# Patient Record
Sex: Female | Born: 1970 | Race: White | Hispanic: Yes | Marital: Single | Smoking: Never smoker
Health system: Southern US, Community
[De-identification: ages and names within clinical notes are randomized; demographics above are authoritative.]

## PROBLEM LIST (undated history)

## (undated) DIAGNOSIS — D500 Iron deficiency anemia secondary to blood loss (chronic): Secondary | ICD-10-CM

## (undated) DIAGNOSIS — D649 Anemia, unspecified: Secondary | ICD-10-CM

## (undated) DIAGNOSIS — N939 Abnormal uterine and vaginal bleeding, unspecified: Secondary | ICD-10-CM

## (undated) DIAGNOSIS — D259 Leiomyoma of uterus, unspecified: Secondary | ICD-10-CM

## (undated) HISTORY — PX: TUBAL LIGATION: SHX77

## (undated) HISTORY — PX: CHOLECYSTECTOMY: SHX55

---

## 2013-10-21 HISTORY — PX: CHOLECYSTECTOMY, LAPAROSCOPIC: SHX56

## 2021-02-08 ENCOUNTER — Other Ambulatory Visit: Payer: Self-pay

## 2021-02-08 ENCOUNTER — Emergency Department (HOSPITAL_COMMUNITY): Payer: Self-pay

## 2021-02-08 ENCOUNTER — Encounter (HOSPITAL_COMMUNITY): Payer: Self-pay

## 2021-02-08 ENCOUNTER — Emergency Department (HOSPITAL_COMMUNITY)
Admission: EM | Admit: 2021-02-08 | Discharge: 2021-02-08 | Disposition: A | Discharge status: Home / Self Care | Payer: Self-pay | Attending: Emergency Medicine | Admitting: Emergency Medicine

## 2021-02-08 DIAGNOSIS — R202 Paresthesia of skin: Secondary | ICD-10-CM | POA: Insufficient documentation

## 2021-02-08 DIAGNOSIS — M542 Cervicalgia: Secondary | ICD-10-CM | POA: Insufficient documentation

## 2021-02-08 DIAGNOSIS — R42 Dizziness and giddiness: Secondary | ICD-10-CM

## 2021-02-08 DIAGNOSIS — D649 Anemia, unspecified: Secondary | ICD-10-CM | POA: Insufficient documentation

## 2021-02-08 HISTORY — DX: Anemia, unspecified: D64.9

## 2021-02-08 LAB — COMPREHENSIVE METABOLIC PANEL
ALT: 16 U/L (ref 0–44)
AST: 23 U/L (ref 15–41)
Albumin: 3.4 g/dL — ABNORMAL LOW (ref 3.5–5.0)
Alkaline Phosphatase: 70 U/L (ref 38–126)
Anion gap: 8 (ref 5–15)
BUN: 8 mg/dL (ref 6–20)
CO2: 22 mmol/L (ref 22–32)
Calcium: 9 mg/dL (ref 8.9–10.3)
Chloride: 105 mmol/L (ref 98–111)
Creatinine, Ser: 0.61 mg/dL (ref 0.44–1.00)
GFR, Estimated: >60 mL/min (ref >60)
Glucose, Bld: 111 mg/dL — ABNORMAL HIGH (ref 70–99)
Potassium: 3.8 mmol/L (ref 3.5–5.1)
Sodium: 135 mmol/L (ref 135–145)
Total Bilirubin: 0.2 mg/dL — ABNORMAL LOW (ref 0.3–1.2)
Total Protein: 7.1 g/dL (ref 6.5–8.1)

## 2021-02-08 LAB — I-STAT BETA HCG BLOOD, ED (MC, WL, AP ONLY): I-stat hCG, quantitative: <5 m[IU]/mL (ref <5)

## 2021-02-08 LAB — CBC
HCT: 29.8 % — ABNORMAL LOW (ref 36.0–46.0)
Hemoglobin: 8.2 g/dL — ABNORMAL LOW (ref 12.0–15.0)
MCH: 20.6 pg — ABNORMAL LOW (ref 26.0–34.0)
MCHC: 27.5 g/dL — ABNORMAL LOW (ref 30.0–36.0)
MCV: 74.9 fL — ABNORMAL LOW (ref 80.0–100.0)
Platelets: 382 10*3/uL (ref 150–400)
RBC: 3.98 MIL/uL (ref 3.87–5.11)
RDW: 17.3 % — ABNORMAL HIGH (ref 11.5–15.5)
WBC: 8.2 10*3/uL (ref 4.0–10.5)
nRBC: 0 % (ref 0.0–0.2)

## 2021-02-08 LAB — TROPONIN I (HIGH SENSITIVITY)
Troponin I (High Sensitivity): 4 ng/L (ref <18)
Troponin I (High Sensitivity): 3 ng/L (ref <18)

## 2021-02-08 MED ORDER — SODIUM CHLORIDE 0.9 % IV BOLUS
1000.0000 mL | Freq: Once | INTRAVENOUS | Status: AC
  Administered 2021-02-08: 1000 mL via INTRAVENOUS

## 2021-02-08 MED ORDER — PROCHLORPERAZINE EDISYLATE 10 MG/2ML IJ SOLN
10.0000 mg | Freq: Once | INTRAMUSCULAR | Status: AC
  Administered 2021-02-08: 10 mg via INTRAVENOUS
  Filled 2021-02-08: qty 2

## 2021-02-08 MED ORDER — LIDOCAINE 5 % EX PTCH: 1.0000 | MEDICATED_PATCH | Freq: Every day | CUTANEOUS | 0 refills | Status: DC | PRN

## 2021-02-08 MED ORDER — DIPHENHYDRAMINE HCL 50 MG/ML IJ SOLN
12.5000 mg | Freq: Once | INTRAMUSCULAR | Status: AC
  Administered 2021-02-08: 12.5 mg via INTRAVENOUS
  Filled 2021-02-08: qty 1

## 2021-02-08 MED ORDER — NAPROXEN 500 MG PO TABS: 500.0000 mg | ORAL_TABLET | Freq: Two times a day (BID) | ORAL | 0 refills | Status: DC | PRN

## 2021-02-08 MED ORDER — METHOCARBAMOL 500 MG PO TABS: 500.0000 mg | ORAL_TABLET | Freq: Three times a day (TID) | ORAL | 0 refills | Status: DC | PRN

## 2021-02-08 MED ORDER — IOHEXOL 350 MG/ML SOLN
75.0000 mL | Freq: Once | INTRAVENOUS | Status: AC | PRN
  Administered 2021-02-08: 75 mL via INTRAVENOUS

## 2021-02-08 MED ORDER — LIDOCAINE 5 % EX PTCH
1.0000 | MEDICATED_PATCH | CUTANEOUS | Status: DC
  Administered 2021-02-08: 1 via TRANSDERMAL
  Filled 2021-02-08: qty 1

## 2021-02-08 NOTE — ED Triage Notes (Addendum)
Pt was at work - pt suddenly felt cold suddenly had intense pain to whole neck. Pt has been having neck pain x 1 week but got worse today.

## 2021-02-08 NOTE — ED Provider Notes (Signed)
[facility] EMERGENCY DEPARTMENT Provider Note   CSN: [id number] Arrival date & time: 02/08/21  0008     History Chief Complaint  Patient presents with  . Neck Pain  . Near Syncope    Autumn Duncan is a 50 y.o. female with a hx of anemia who presents to the ED with multiple complaints/concerns tonight. Patient states her primary concern is her neck pain, reports pain to the neck, primarily on the left side which has been fairly constant for the past 1-2 weeks, worse with movement, no alleviating factors. Had some brief LUE paresthesias over 1 week ago that have not reoccurred. No other numbness or weakness. She states that sometimes the pain in her neck radiates into her head. 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 Her co-worker informed her she was crying during this episode but she does not recall. She had a similar episode last week and has had them in the past when living in [place]. She has had some increased stress.She also mentions some intermittent left jaw pain- states sometimes she thinks it looks swollen there  She denies complete syncope, dyspnea, hemoptysis, leg pain/swelling, fever, IVDU, history of cancer, history of VTE, incontinence, retention, or saddle anesthesia.  No recent injuries. Currently only complaint is neck/head/jaw pain.   Interpreter utilized throughout Audiological scientist. HPI     Past Medical History:  Diagnosis Date  . Anemia     There are no problems to display for this patient.   Past Surgical History:  Procedure Laterality Date  . CHOLECYSTECTOMY       OB History   No obstetric history on file.     No family history on file.  Social History   Tobacco Use  . Smoking status: Never Smoker  . Smokeless tobacco: Never Used  Substance Use Topics  . Alcohol use: Not Currently    Alcohol/week: 1.0 - 2.0  standard drink    Types: 1 - 2 Cans of beer per week  . Drug use: Never    Home Medications Prior to Admission medications   Not on File    Allergies    Patient has no known allergies.  Review of Systems   Review of Systems  Constitutional: Negative for chills and fever.  HENT:       Positive for jaw pain.   Respiratory: Negative for shortness of breath.   Cardiovascular: Positive for chest pain. Negative for leg swelling.  Gastrointestinal: Negative for abdominal pain, blood in stool and vomiting.  Genitourinary: Negative for dysuria.  Musculoskeletal: Positive for neck pain.  Neurological: Positive for dizziness, light-headedness and headaches. Negative for syncope, weakness and numbness.  All other systems reviewed and are negative.   Physical Exam Updated Vital Signs BP (!) 151/81 (BP Location: Left Arm)   Pulse 73   Temp 98.4 F (36.9 C)   Resp 17   Ht 5\' 6"  (1.676 m)   Wt 81.6 kg   SpO2 100%   BMI 29.05 kg/m   Physical Exam Vitals and nursing note reviewed.  Constitutional:      General: She is not in acute distress.    Appearance: She is well-developed. She is not toxic-appearing.  HENT:     Head: Normocephalic and atraumatic.     Right Ear: Tympanic membrane is not perforated, erythematous, retracted or bulging.     Left Ear: Tympanic membrane is not  perforated, erythematous, retracted or bulging.     Nose: Nose normal.     Mouth/Throat:     Pharynx: Oropharynx is clear. Uvula midline.     Comments: Upper dentures present & removed. No gingival erythema, swelling, or fluctuance noted.  No gross abscess. Posterior oropharynx is symmetric appearing. Patient tolerating own secretions without difficulty. No trismus. No drooling. No hot potato voice. No swelling beneath the tongue, submandibular compartment is soft.   Patient has tenderness palpation over the left TMJ.  She is able to fully open and close her mouth.  Eyes:     General: Vision grossly  intact. Gaze aligned appropriately.        Right eye: No discharge.        Left eye: No discharge.     Extraocular Movements: Extraocular movements intact.     Conjunctiva/sclera: Conjunctivae normal.     Comments: PERRL. No proptosis.   Cardiovascular:     Rate and Rhythm: Normal rate and regular rhythm.     Comments: 2+ symmetric radial pulses. Pulmonary:     Effort: Pulmonary effort is normal. No respiratory distress.     Breath sounds: Normal breath sounds. No wheezing, rhonchi or rales.  Abdominal:     General: There is no distension.     Palpations: Abdomen is soft.     Tenderness: There is no abdominal tenderness. There is no guarding or rebound.  Musculoskeletal:     Cervical back: Normal range of motion and neck supple. No rigidity. Muscular tenderness (left sided) present. No spinous process tenderness.  Skin:    General: Skin is warm and dry.     Findings: No rash.  Neurological:     Comments: Alert. Clear speech. No facial droop. CNIII-XII grossly intact. Bilateral upper and lower extremities' sensation grossly intact. 5/5 symmetric strength with grip strength and with plantar and dorsi flexion bilaterally . Normal finger to nose bilaterally. Negative pronator drift. Negative Romberg sign. Gait is steady and intact.   Psychiatric:        Behavior: Behavior normal.     ED Results / Procedures / Treatments   Labs (all labs ordered are listed, but only abnormal results are displayed) Labs Reviewed  COMPREHENSIVE METABOLIC PANEL - Abnormal; Notable for the following components:      Result Value   Glucose, Bld 111 (*)    Albumin 3.4 (*)    Total Bilirubin 0.2 (*)    All other components within normal limits  CBC - Abnormal; Notable for the following components:   Hemoglobin 8.2 (*)    HCT 29.8 (*)    MCV 74.9 (*)    MCH 20.6 (*)    MCHC 27.5 (*)    RDW 17.3 (*)    All other components within normal limits  I-STAT BETA HCG BLOOD, ED ([facility], [facility], [facility] ONLY)  TROPONIN I  (HIGH SENSITIVITY)  TROPONIN I (HIGH SENSITIVITY)    EKG None  Radiology CT Angio Head W or Wo Contrast  Result Date: 02/08/2021 CLINICAL DATA:  Sudden onset intense pain in the neck. EXAM: CT ANGIOGRAPHY HEAD AND NECK TECHNIQUE: Multidetector CT imaging of the head and neck was performed using the standard protocol during bolus administration of intravenous contrast. Multiplanar CT image reconstructions and MIPs were obtained to evaluate the vascular anatomy. Carotid stenosis measurements (when applicable) are obtained utilizing NASCET criteria, using the distal internal carotid diameter as the denominator. CONTRAST:  75mL OMNIPAQUE IOHEXOL 350 MG/ML SOLN COMPARISON:  None. FINDINGS: CT  HEAD FINDINGS Brain: No evidence of acute infarction, hemorrhage, hydrocephalus, extra-axial collection or mass lesion/mass effect. Mineralization at the bilateral globus pallidus, likely incidental for age. Vascular: See below Skull: No fracture or destructive lesion. Mastoids and middle ears are clear. Sinuses: Imaged portions are clear. Orbits: Negative Review of the MIP images confirms the above findings CTA NECK FINDINGS Aortic arch: Normal with 3 vessel branching. Right carotid system: Vessels are smooth and widely patent with no atheromatous changes. Left carotid system: Vessels are smooth and widely patent with no atheromatous changes. Vertebral arteries: No proximal subclavian or vertebral stenosis. The vertebral arteries are smoothly contoured. Skeleton: Negative Other neck: No visible inflammation or hemorrhage. Bilateral thyroid nodules measuring up to 9 mm on the right. No followup recommended (ref: J Am Coll Radiol. 2015 Feb;12(2): 143-50). Upper chest: Negative Review of the MIP images confirms the above findings CTA HEAD FINDINGS Anterior circulation: No significant stenosis, proximal occlusion, aneurysm, or vascular malformation. Posterior circulation: No significant stenosis, proximal occlusion, aneurysm,  or vascular malformation. Venous sinuses: As permitted by contrast timing, patent. Anatomic variants: None Review of the MIP images confirms the above findings IMPRESSION: Negative CTA of the head and neck. Electronically Signed   By: Marnee Spring M.D.   On: 02/08/2021 06:29   DG Chest 2 View  Result Date: 02/08/2021 CLINICAL DATA:  Chest pain EXAM: CHEST - 2 VIEW COMPARISON:  None. FINDINGS: Normal heart size and mediastinal contours. No acute infiltrate or edema. No effusion or pneumothorax. No acute osseous findings. IMPRESSION: Negative chest. Electronically Signed   By: Marnee Spring M.D.   On: 02/08/2021 05:02   CT Angio Neck W and/or Wo Contrast  Result Date: 02/08/2021 CLINICAL DATA:  Sudden onset intense pain in the neck. EXAM: CT ANGIOGRAPHY HEAD AND NECK TECHNIQUE: Multidetector CT imaging of the head and neck was performed using the standard protocol during bolus administration of intravenous contrast. Multiplanar CT image reconstructions and MIPs were obtained to evaluate the vascular anatomy. Carotid stenosis measurements (when applicable) are obtained utilizing NASCET criteria, using the distal internal carotid diameter as the denominator. CONTRAST:  66mL OMNIPAQUE IOHEXOL 350 MG/ML SOLN COMPARISON:  None. FINDINGS: CT HEAD FINDINGS Brain: No evidence of acute infarction, hemorrhage, hydrocephalus, extra-axial collection or mass lesion/mass effect. Mineralization at the bilateral globus pallidus, likely incidental for age. Vascular: See below Skull: No fracture or destructive lesion. Mastoids and middle ears are clear. Sinuses: Imaged portions are clear. Orbits: Negative Review of the MIP images confirms the above findings CTA NECK FINDINGS Aortic arch: Normal with 3 vessel branching. Right carotid system: Vessels are smooth and widely patent with no atheromatous changes. Left carotid system: Vessels are smooth and widely patent with no atheromatous changes. Vertebral arteries: No  proximal subclavian or vertebral stenosis. The vertebral arteries are smoothly contoured. Skeleton: Negative Other neck: No visible inflammation or hemorrhage. Bilateral thyroid nodules measuring up to 9 mm on the right. No followup recommended (ref: J Am Coll Radiol. 2015 Feb;12(2): 143-50). Upper chest: Negative Review of the MIP images confirms the above findings CTA HEAD FINDINGS Anterior circulation: No significant stenosis, proximal occlusion, aneurysm, or vascular malformation. Posterior circulation: No significant stenosis, proximal occlusion, aneurysm, or vascular malformation. Venous sinuses: As permitted by contrast timing, patent. Anatomic variants: None Review of the MIP images confirms the above findings IMPRESSION: Negative CTA of the head and neck. Electronically Signed   By: Marnee Spring M.D.   On: 02/08/2021 06:29    Procedures Procedures   Medications Ordered  in ED Medications - No data to display  ED Course  I have reviewed the triage vital signs and the nursing notes.  Pertinent labs & imaging results that were available during my care of the patient were reviewed by me and considered in my medical decision making (see chart for details).    MDM Rules/Calculators/A&P                         Patient presents to the ED with complaints of head/neck pain for the past 1 to 2 weeks, near syncope this evening, as well as some intermittent jaw pain.  Patient is nontoxic, resting comfortably, her vitals are notable for mildly elevated blood pressure and tachycardia which has normalized.  I have a low suspicion for hypertensive emergency.  On exam patient does have left cervical paraspinal muscle tenderness to palpation and left TMJ tenderness. She has no focal neurologic deficits.  Exam is otherwise benign.   Additional history obtained:  Additional history obtained from chart review & nursing note review.   EKG: No significant arrhythmias or ischemic changes noted. Lab Tests:   I Ordered, reviewed, and interpreted labs, which included:  CBC: Anemia with hemoglobin of 8.2 and hematocrit of 29.8,known hx of anemia- no prior labs on record.  No leukocytosis. CMP: Mild hypoalbuminemia.  No other significant derangements. Pregnancy test: Negative Troponin: Within normal limit  Imaging Studies ordered:  I ordered imaging studies which included CXR & CTA head/neck, I independently reviewed, formal radiology impression shows:  CXR: Negative chest CTA head/neck: Negative CTA of the head and neck.   ED Course:  Labs with anemia, known history of this, no prior on record for comparison, PCP follow-up.  Work- up overall reassuring thus far. EKG without acute ischemia, initial troponin within normal limits, delta pending.  Patient is not hypoxic, no chest pain/dyspnea at this time, low suspicion for PE.  Her chest x-ray does not show infiltrate, pneumothorax, or pulmonary edema.  EKG without arrhythmia.  In regards to her head/neck pain with a near syncopal event CT angio does not show acute process.  She has no focal neurologic deficits.  Her neck pain is reproducible with left cervical paraspinal muscle palpation which I think is likely contributing to her symptoms potentially the underlying cause.  She also has some tenderness to the left TMJ which I suspect is the underlying cause of her jaw pain.  She has no intraoral infection noted, no abscess, exam not consistent with Ludwig's angina.   I have ordered fluids, a migraine cocktail, and a Lidoderm patch for symptomatic care.  06:30: Patient care signed out to [facility], PA-C at change of shift pending delta troponin & re-evaluation.  If no significant abnormality anticipate discharge home with supportive care and primary care follow-up  Findings and plan of care discussed with supervising physician Dr. Pilar Plate who is in agreement.   Portions of this note were generated with Scientist, clinical (histocompatibility and immunogenetics). Dictation errors may  occur despite best attempts at proofreading.  Final Clinical Impression(s) / ED Diagnoses Final diagnoses:  Neck pain  Lightheadedness  Anemia, unspecified type    Rx / DC Orders ED Discharge Orders    None       Cherly Anderson, PA-C 02/08/21 7510    Sabas Sous, MD 02/08/21 [phone]

## 2021-02-08 NOTE — ED Triage Notes (Signed)
Emergency Medicine Provider Triage Evaluation Note  Autumn Duncan , a 50 y.o. female  was evaluated in triage.  Pt complains of neck pain x 1 week. More so to the left side. Also reports left upper dental pain. Denies injury. Had some LUE paresthesias a few days ago, none since.   Review of Systems  Positive: Dental pain, neck pain, episode of paresthesias.  Negative: Fever, syncope, dysphagia, chest pain, dyspnea, complete numbness, weakness.   Physical Exam  BP (!) 145/89   Pulse (!) 106   Temp 98.4 F (36.9 C)   Resp 16   Ht 5\' 6"  (1.676 m)   Wt 81.6 kg   SpO2 100%   BMI 29.05 kg/m  Gen:   Awake, no distress   HEENT:  Atraumatic.  Uvula midline.  Airway patent.   Resp:  Normal effort Cardiac:  Mild tachycardia Abd:   Nondistended, nontender.  MSK:   Left cervical paraspinal muscle tenderness.  Neuro:  Speech clear. Sensation grossly intact to bilateral upper extremities.  5/5 Symmetric grip strength.  Medical Decision Making  Medically screening exam initiated at 12:31 AM.  Appropriate orders placed.  Niala Stcharles was informed that the remainder of the evaluation will be completed by another provider, this initial triage assessment does not replace that evaluation, and the importance of remaining in the ED until their evaluation is complete.  Clinical Impression  Neck pain  Interpretor utilized during encounter.    Gale Journey, Cherly Anderson 02/08/21 [phone]

## 2021-02-08 NOTE — Discharge Instructions (Addendum)
You were seen in the Er today for neck pain, headache, dizziness, and jaw pain.  Your work-up was overall reassuring.  Your labs did show that you are anemic with a hemoglobin of 8.3, please have this rechecked by your primary care provider.  Your heart enzyme and chest x-ray were normal.  The CT scans of your head and neck did not show any significant abnormalities.  We suspect that the pain in your neck and the pain in your jaw could be related to muscle pain/joint irritation.  We are sending you home with the following medicines:  - Naproxen is a nonsteroidal anti-inflammatory medication that will help with pain and swelling. Be sure to take this medication as prescribed with food, 1 pill every 12 hours,  It should be taken with food, as it can cause stomach upset, and more seriously, stomach bleeding. Do not take other nonsteroidal anti-inflammatory medications with this such as Advil, Motrin, Aleve, Mobic, Goodie Powder, or Motrin.    - Robaxin is the muscle relaxer I have prescribed, this is meant to help with muscle tightness. Be aware that this medication may make you drowsy therefore the first time you take this it should be at a time you are in an environment where you can rest. Do not drive or operate heavy machinery when taking this medication. Do not drink alcohol or take other sedating medications with this medicine such as narcotics or benzodiazepines.   - Lidoderm patch-apply 1 patch to area of most significant pain in your neck once per day.  Remove and discard patch within 12 hours of application. You make take Tylenol per over the counter dosing with these medications.   We have prescribed you new medication(s) today. Discuss the medications prescribed today with your pharmacist as they can have adverse effects and interactions with your other medicines including over the counter and prescribed medications. Seek medical evaluation if you start to experience new or abnormal symptoms  after taking one of these medicines, seek care immediately if you start to experience difficulty breathing, feeling of your throat closing, facial swelling, or rash as these could be indications of a more serious allergic reaction   Please be sure to stay well-hydrated.  Please follow-up primary care, call today to schedule follow-up appointment as soon as possible.  If you do not have a primary care provider please see attached circled phone number.  Return to the ER for any new or worsening symptoms or any other concerns.  Google Translate: Lo vieron en Urgencias hoy por dolor de cuello, dolor de [place], mareos y Engineer, mining de [place]. Su evaluacin fue en general tranquilizadora. Sus anlisis mostraron que usted est anmico con una hemoglobina de 8.3, haga que su proveedor de atencin primaria lo vuelva a Dentist. Su enzima cardaca y la radiografa de trax fueron normales. Las tomografas computarizadas de su cabeza y cuello no mostraron anomalas significativas.  Sospechamos que Engineer, site cuello y Chief Technology Officer en la mandbula podran estar relacionados con el dolor muscular o la irritacin de las articulaciones. Te estamos enviando a casa con los siguientes medicamentos:  - El naproxeno es un medicamento antiinflamatorio no esteroideo que ayudar con Chief Technology Officer y la hinchazn. Asegrese de tomar [employer] segn lo prescrito con alimentos, 1 pastilla cada 12 horas. Debe tomarse con alimentos, ya que puede causar [employer] y, lo que es ms grave, sangrado estomacal. No tome otros medicamentos antiinflamatorios no esteroideos con esto, como Advil, Motrin, Aleve, Mobic, Goodie Powder  o Motrin.  - Robaxin es el relajante muscular que le he recetado, est destinado a ayudar con la tensin muscular. Tenga en cuenta que este medicamento puede provocarle somnolencia, por lo que la primera vez que lo tome debe ser en un momento en el que se encuentre en un entorno en el que pueda descansar. No  conduzca ni maneje maquinaria pesada mientras toma este medicamento. No beba alcohol ni tome otros medicamentos sedantes con este medicamento, como narcticos o benzodiazepinas.  - Parche de Lidoderm: aplique 1 parche en el rea de mayor dolor en el cuello una vez al da. Retire y deseche el parche dentro de las 12 horas posteriores a la aplicacin. Debe tomar Tylenol por dosificacin de venta libre con estos medicamentos.  Le hemos recetado nuevos medicamentos hoy. Hable sobre los medicamentos recetados hoy con su farmacutico, ya que pueden tener efectos adversos e interacciones con sus otros medicamentos, incluidos los medicamentos recetados y de [place]. Busque una evaluacin mdica si comienza a experimentar sntomas nuevos o anormales despus de tomar uno de estos medicamentos, busque atencin mdica de inmediato si comienza a experimentar dificultad para respirar, sensacin de que se le cierra la garganta, hinchazn facial o sarpullido, ya que estos podran ser indicios de una enfermedad ms grave. reaccin alrgica   Por favor, asegrese de mantenerse bien hidratado. Haga un seguimiento de atencin primaria, llame hoy para programar una cita de seguimiento lo antes posible. Si no tiene un proveedor de atencin primaria, consulte el nmero de telfono adjunto con un crculo. Regrese a la sala de emergencias por cualquier sntoma nuevo o que empeore o cualquier otra inquietud.

## 2021-02-08 NOTE — ED Provider Notes (Signed)
Patient is a 50 year old female whose care was transferred to me at shift change from [facility].  Her HPI is below:  Autumn Duncan is a 50 y.o. female with a hx of anemia who presents to the ED with multiple complaints/concerns tonight. Patient states her primary concern is her neck pain, reports pain to the neck, primarily on the left side which has been fairly constant for the past 1-2 weeks, worse with movement, no alleviating factors. Had some brief LUE paresthesias over 1 week ago that have not reoccurred. No other numbness or weakness. She states that sometimes the pain in her neck radiates into her head. 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 Her co-worker informed her she was crying during this episode but she does not recall. She had a similar episode last week and has had them in the past when living in [place]. She has had some increased stress.She also mentions some intermittent left jaw pain- states sometimes she thinks it looks swollen there  She denies complete syncope, dyspnea, hemoptysis, leg pain/swelling, fever, IVDU, history of cancer, history of VTE, incontinence, retention, or saddle anesthesia.  No recent injuries. Currently only complaint is neck/head/jaw pain.   Interpreter utilized throughout Audiological scientist. Physical Exam  BP (!) 146/78 (BP Location: Right Arm)   Pulse 76   Temp 98.4 F (36.9 C)   Resp 18   Ht 5\' 6"  (1.676 m)   Wt 81.6 kg   SpO2 100%   BMI 29.05 kg/m   Physical Exam Vitals and nursing note reviewed.  Constitutional:      General: She is not in acute distress.    Appearance: She is well-developed. She is not toxic-appearing.  HENT:     Head: Normocephalic and atraumatic.     Right Ear: Tympanic membrane is not perforated, erythematous, retracted or bulging.     Left Ear: Tympanic membrane is not perforated, erythematous,  retracted or bulging.     Nose: Nose normal.     Mouth/Throat:     Pharynx: Oropharynx is clear. Uvula midline.     Comments: Upper dentures present & removed. No gingival erythema, swelling, or fluctuance noted.  No gross abscess. Posterior oropharynx is symmetric appearing. Patient tolerating own secretions without difficulty. No trismus. No drooling. No hot potato voice. No swelling beneath the tongue, submandibular compartment is soft.   Patient has tenderness palpation over the left TMJ.  She is able to fully open and close her mouth.  Eyes:     General: Vision grossly intact. Gaze aligned appropriately.        Right eye: No discharge.        Left eye: No discharge.     Extraocular Movements: Extraocular movements intact.     Conjunctiva/sclera: Conjunctivae normal.     Comments: PERRL. No proptosis.   Cardiovascular:     Rate and Rhythm: Normal rate and regular rhythm.     Comments: 2+ symmetric radial pulses. Pulmonary:     Effort: Pulmonary effort is normal. No respiratory distress.     Breath sounds: Normal breath sounds. No wheezing, rhonchi or rales.  Abdominal:     General: There is no distension.     Palpations: Abdomen is soft.     Tenderness: There is no abdominal tenderness. There is no guarding or rebound.  Musculoskeletal:     Cervical back: Normal range of motion and neck supple.  No rigidity. Muscular tenderness (left sided) present. No spinous process tenderness.  Skin:    General: Skin is warm and dry.     Findings: No rash.  Neurological:     Comments: Alert. Clear speech. No facial droop. CNIII-XII grossly intact. Bilateral upper and lower extremities' sensation grossly intact. 5/5 symmetric strength with grip strength and with plantar and dorsi flexion bilaterally . Normal finger to nose bilaterally. Negative pronator drift. Negative Romberg sign. Gait is steady and intact.   Psychiatric:        Behavior: Behavior normal.  ED Course/Procedures      Procedures  MDM  Patient is a 50 year old female who presents the emergency department due to neck pain and paresthesias.  Her care was transferred to me at shift change from [facility].  Please see her note below for additional information.  Patient given a migraine cocktail as well as a lidocaine patch with improvement in her symptoms.  She states she is feeling much better.  Initial troponin of 4 with a repeat of 3.  CTA was obtained of the head and neck which were negative.  Unsure the cause of her symptoms.  Likely musculoskeletal nature.  She was given prescriptions for Naprosyn as well as Robaxin.  Discussed return precautions at length.  Feel that she is stable for discharge at this time and she is agreeable.  Her questions were answered and she was amicable at the time of discharge.      Placido Sou, PA-C 02/08/21 [id number]    Eber Hong, MD 02/09/21 [phone]

## 2021-02-13 ENCOUNTER — Encounter (INDEPENDENT_AMBULATORY_CARE_PROVIDER_SITE_OTHER): Payer: Self-pay

## 2021-02-13 ENCOUNTER — Other Ambulatory Visit: Payer: Self-pay

## 2021-02-19 NOTE — Progress Notes (Signed)
[id number]  Subjective:    Autumn Duncan - 50 y.o. female MRN [id number]  Date of birth: 07-07-1971  HPI  Autumn Duncan is to establish care and hospital follow-up.   Current issues and/or concerns: Visit 02/08/2021 at [facility] Emergency Department per MD note: Patient is a 50 year old female who presents the emergency department due to neck pain and paresthesias.  Her care was transferred to me at shift change from [facility].  Please see her note below for additional information.  Patient given a migraine cocktail as well as a lidocaine patch with improvement in her symptoms.  She states she is feeling much better.  Initial troponin of 4 with a repeat of 3.  CTA was obtained of the head and neck which were negative.  Unsure the cause of her symptoms.  Likely musculoskeletal nature.  She was given prescriptions for Naprosyn as well as Robaxin.  Discussed return precautions at length.  Feel that she is stable for discharge at this time and she is agreeable.  Her questions were answered and she was amicable at the time of discharge.  02/20/2021: NECK PAIN FOLLOW UP: Reports since hospital discharge still having intermittent head and neck pain. Left side of neck worse than right side. Jaw pain still present, left greater than right. Difficult to chew because of jaw pain. Chest discomfort still comes and goes. Denies any recent near-syncopal/syncopal events.   ANEMIA FOLLOW-UP: Reports she moved from [place] to [place] in June 2021. About 1 year ago while still living in [place] she had two oral surgeries which resulted in post-op heavy bleeding and blood transfusions administered. Around the same time she developed irregular menses which would last up to 15 days. Reports using 2 sanitary napkins per hour, described as blood pouring out, and had to lay in bed for 5 days to help control bleeding. She was eventually started on iron pills. She is still having  heavy and irregular menses with heavy bleeding.   ROS per HPI    Health Maintenance:  Health Maintenance Due  Topic Date Due  . Hepatitis C Screening  Never done  . COVID-19 Vaccine (1) Never done  . HIV Screening  Never done  . TETANUS/TDAP  Never done  . PAP SMEAR-Modifier  Never done  . COLONOSCOPY (Pts 45-22yrs Insurance coverage will need to be confirmed)  Never done  . MAMMOGRAM  Never done    Past Medical History: There are no problems to display for this patient.  Social History   reports that she has never smoked. She has never used smokeless tobacco. She reports previous alcohol use of about 1.0 - 2.0 standard drink of alcohol per week. She reports that she does not use drugs.   Family History  family history is not on file.   Medications: reviewed and updated   Objective:   Physical Exam BP 124/84 (BP Location: Left Arm, Patient Position: Sitting, Cuff Size: Large)   Pulse 100   Temp 98.4 F (36.9 C) (Oral)   Resp 16   Ht 5' 3.75" (1.619 m)   Wt 194 lb (88 kg)   LMP 02/18/2021 (Exact Date)   SpO2 98%   BMI 33.56 kg/m  Physical Exam HENT:     Head: Normocephalic and atraumatic.  Eyes:     Extraocular Movements: Extraocular movements intact.     Conjunctiva/sclera: Conjunctivae normal.     Pupils: Pupils are equal, round, and reactive to light.  Cardiovascular:  Rate and Rhythm: Normal rate.     Pulses: Normal pulses.     Heart sounds: Normal heart sounds.  Pulmonary:     Effort: Pulmonary effort is normal.     Breath sounds: Normal breath sounds.  Musculoskeletal:     Cervical back: Normal range of motion and neck supple.  Neurological:     General: No focal deficit present.     Mental Status: She is alert and oriented to person, place, and time.  Psychiatric:        Mood and Affect: Mood normal.        Behavior: Behavior normal.     Assessment & Plan:  1. Encounter to establish care: - Patient presents today to establish care.  -  Return for annual physical examination, labs, and health maintenance. Arrive fasting meaning having no for at least 8 hours prior to appointment. You may have only water or black coffee. Please take scheduled medications as normal.  2. Hospital discharge follow-up - Stable since hospital discharge.   3. Neck pain: - CTA of the head and neck last obtained 02/08/2021 which were negative. - Ibuprofen as prescribed.  - Follow-up with primary provider as scheduled.  - ibuprofen (ADVIL) 600 MG tablet; Take 1 tablet (600 mg total) by mouth every 8 (eight) hours as needed for mild pain or moderate pain.  Dispense: 30 tablet; Refill: 0  4. Light-headedness: 5. Anemia, unspecified type: 6. Irregular menses: - Ongoing for at least 1 year.  - Light-headedness possibly related to history of anemia and irregular heavy menses.  - CBC to screen for anemia. - Referral to Gynecology for further evaluation and management.  - CBC - Ambulatory referral to Gynecology  7. Language barrier: - [employer] participated during today's visit. Interpreter Name: Byrd Hesselbach, ID#: [id number].   Patient was given clear instructions to go to Emergency Department or return to medical center if symptoms don't improve, worsen, or new problems develop.The patient verbalized understanding.  I discussed the assessment and treatment plan with the patient. The patient was provided an opportunity to ask questions and all were answered. The patient agreed with the plan and demonstrated an understanding of the instructions.   The patient was advised to call back or seek an in-person evaluation if the symptoms worsen or if the condition fails to improve as anticipated.    Ricky Stabs, NP 02/22/2021, 9:38 PM Primary Care at [facility]

## 2021-02-20 ENCOUNTER — Other Ambulatory Visit: Payer: Self-pay

## 2021-02-20 ENCOUNTER — Encounter: Payer: Self-pay | Admitting: Family

## 2021-02-20 ENCOUNTER — Ambulatory Visit (INDEPENDENT_AMBULATORY_CARE_PROVIDER_SITE_OTHER): Payer: Self-pay | Admitting: Family

## 2021-02-20 VITALS — BP 124/84 | HR 100 | Temp 98.4°F | Resp 16 | Ht 63.75 in | Wt 194.0 lb

## 2021-02-20 DIAGNOSIS — M542 Cervicalgia: Secondary | ICD-10-CM

## 2021-02-20 DIAGNOSIS — Z789 Other specified health status: Secondary | ICD-10-CM

## 2021-02-20 DIAGNOSIS — Z09 Encounter for follow-up examination after completed treatment for conditions other than malignant neoplasm: Secondary | ICD-10-CM

## 2021-02-20 DIAGNOSIS — D649 Anemia, unspecified: Secondary | ICD-10-CM

## 2021-02-20 DIAGNOSIS — N926 Irregular menstruation, unspecified: Secondary | ICD-10-CM

## 2021-02-20 DIAGNOSIS — R42 Dizziness and giddiness: Secondary | ICD-10-CM

## 2021-02-20 DIAGNOSIS — Z7689 Persons encountering health services in other specified circumstances: Secondary | ICD-10-CM

## 2021-02-20 MED ORDER — IBUPROFEN 600 MG PO TABS: 600.0000 mg | ORAL_TABLET | Freq: Three times a day (TID) | ORAL | 0 refills | Status: DC | PRN

## 2021-02-20 NOTE — Patient Instructions (Signed)

## 2021-02-20 NOTE — Progress Notes (Signed)
HFU concerns- States she has occassional chest discomfort Denies chest pain at OV  Neck pain and dizziness  Has not taken medication Rx

## 2021-03-06 ENCOUNTER — Telehealth: Payer: Self-pay | Admitting: Family

## 2021-03-07 ENCOUNTER — Ambulatory Visit: Payer: Self-pay

## 2021-03-13 NOTE — Progress Notes (Signed)
Patient ID: Autumn Duncan, female    DOB: [birth year]  MRN: [id number]  CC: Annual Physical Exam  Subjective: Autumn Duncan is a 50 y.o. female who presents for annual physical exam.  Her concerns today include: none.  There are no problems to display for this patient.    Current Outpatient Medications on File Prior to Visit  Medication Sig Dispense Refill  . ibuprofen (ADVIL) 600 MG tablet Take 1 tablet (600 mg total) by mouth every 8 (eight) hours as needed for mild pain or moderate pain. 30 tablet 0  . lidocaine (LIDODERM) 5 % Place 1 patch onto the skin daily as needed. Apply patch to area most significant pain once per day.  Remove and discard patch within 12 hours of application. (Patient not taking: Reported on 02/20/2021) 30 patch 0  . methocarbamol (ROBAXIN) 500 MG tablet Take 1 tablet (500 mg total) by mouth every 8 (eight) hours as needed for muscle spasms. (Patient not taking: Reported on 02/20/2021) 15 tablet 0   No current facility-administered medications on file prior to visit.    No Known Allergies  Social History   Socioeconomic History  . Marital status: Single    Spouse name: Not on file  . Number of children: Not on file  . Years of education: Not on file  . Highest education level: Not on file  Occupational History  . Not on file  Tobacco Use  . Smoking status: Never Smoker  . Smokeless tobacco: Never Used  Substance and Sexual Activity  . Alcohol use: Not Currently    Alcohol/week: 1.0 - 2.0 standard drink    Types: 1 - 2 Cans of beer per week  . Drug use: Never  . Sexual activity: Not on file  Other Topics Concern  . Not on file  Social History Narrative  . Not on file   Social Determinants of Health   Financial Resource Strain: Not on file  Food Insecurity: Not on file  Transportation Needs: Not on file  Physical Activity: Not on file  Stress: Not on file  Social Connections: Not on file  Intimate Partner Violence: Not  on file    History reviewed. No pertinent family history.  Past Surgical History:  Procedure Laterality Date  . CHOLECYSTECTOMY      ROS: Review of Systems Negative except as stated above  PHYSICAL EXAM: BP 126/85 (BP Location: Left Arm, Patient Position: Sitting, Cuff Size: Normal)   Pulse 68   Temp 98.4 F (36.9 C)   Resp 16   Ht 5' 3.74" (1.619 m)   Wt 194 lb 4.8 oz (88.1 kg)   LMP 02/18/2021 (Exact Date)   SpO2 100%   BMI 33.62 kg/m    Wt Readings from Last 3 Encounters:  03/14/21 194 lb 4.8 oz (88.1 kg)  02/20/21 194 lb (88 kg)  02/08/21 180 lb (81.6 kg)    Physical Exam HENT:     Head: Normocephalic and atraumatic.     Right Ear: Tympanic membrane, ear canal and external ear normal.     Left Ear: Tympanic membrane, ear canal and external ear normal.     Nose: Nose normal.     Mouth/Throat:     Mouth: Mucous membranes are moist.     Pharynx: Oropharynx is clear.  Eyes:     Extraocular Movements: Extraocular movements intact.     Conjunctiva/sclera: Conjunctivae normal.     Pupils: Pupils are equal, round, and reactive to light.  Cardiovascular:     Rate and Rhythm: Normal rate and regular rhythm.     Pulses: Normal pulses.     Heart sounds: Normal heart sounds.  Pulmonary:     Effort: Pulmonary effort is normal.     Breath sounds: Normal breath sounds.  Chest:     Comments: Patient declined examination. Abdominal:     General: Bowel sounds are normal.     Palpations: Abdomen is soft.  Genitourinary:    Comments: Patient declined examination. Musculoskeletal:        General: Normal range of motion.     Cervical back: Normal range of motion and neck supple.  Skin:    General: Skin is warm and dry.     Capillary Refill: Capillary refill takes less than 2 seconds.  Neurological:     General: No focal deficit present.     Mental Status: She is alert and oriented to person, place, and time.  Psychiatric:        Mood and Affect: Mood normal.         Behavior: Behavior normal.     ASSESSMENT AND PLAN: 1. Annual physical exam: - Counseled on 150 minutes of exercise per week as tolerated, healthy eating (including decreased daily intake of saturated fats, cholesterol, added sugars, sodium), STI prevention, and routine healthcare maintenance.  2. Screening for metabolic disorder: - CMP last obtained 02/08/2021.  3. Diabetes mellitus screening: - Hemoglobin A1c to screen for pre-diabetes/diabetes. - Hemoglobin A1c  4. Screening cholesterol level: - Lipid panel to screen for high cholesterol.  - Lipid panel  5. Thyroid disorder screen: - TSH to check thyroid function.  - TSH  6. Need for hepatitis C screening test: - Hepatitis C antibody to screen for hepatitis C.  - Hepatitis C Antibody  7. Encounter for screening for HIV: - HIV antibody to screen for human immunodeficiency virus.  - HIV antibody (with reflex)  8. Encounter for screening mammogram for malignant neoplasm of breast: - Referral for breast cancer screening by mammogram.  - MM Digital Screening; Future  9. Pap smear for cervical cancer screening: - Referral to Gynecology for cervical cancer screening by PAP smear.  - Ambulatory referral to Gynecology  10. Colon cancer screening: - Referral to Gastroenterology for colon cancer screening by colonoscopy. - Ambulatory referral to Gastroenterology  11. Language barrier: - [employer] participated during today's visit. Interpreter Name: Mer Rouge, ID#: [id number].   Patient was given the opportunity to ask questions.  Patient verbalized understanding of the plan and was able to repeat key elements of the plan. Patient was given clear instructions to go to Emergency Department or return to medical center if symptoms don't improve, worsen, or new problems develop.The patient verbalized understanding.   Orders Placed This Encounter  Procedures  . MM Digital Screening  . Hepatitis C Antibody  . HIV antibody  (with reflex)  . Lipid panel  . TSH  . Hemoglobin A1c  . Ambulatory referral to Gynecology  . Ambulatory referral to Gastroenterology    Follow-up with primary provider as scheduled.   Rema Fendt, NP

## 2021-03-14 ENCOUNTER — Other Ambulatory Visit: Payer: Self-pay

## 2021-03-14 ENCOUNTER — Encounter (INDEPENDENT_AMBULATORY_CARE_PROVIDER_SITE_OTHER): Payer: Self-pay

## 2021-03-14 ENCOUNTER — Encounter: Payer: Self-pay | Admitting: Family

## 2021-03-14 ENCOUNTER — Ambulatory Visit (INDEPENDENT_AMBULATORY_CARE_PROVIDER_SITE_OTHER): Payer: Self-pay | Admitting: Family

## 2021-03-14 VITALS — BP 126/85 | HR 68 | Temp 98.4°F | Resp 16 | Ht 63.74 in | Wt 194.3 lb

## 2021-03-14 DIAGNOSIS — Z0000 Encounter for general adult medical examination without abnormal findings: Secondary | ICD-10-CM

## 2021-03-14 DIAGNOSIS — Z131 Encounter for screening for diabetes mellitus: Secondary | ICD-10-CM

## 2021-03-14 DIAGNOSIS — Z13228 Encounter for screening for other metabolic disorders: Secondary | ICD-10-CM

## 2021-03-14 DIAGNOSIS — D649 Anemia, unspecified: Secondary | ICD-10-CM

## 2021-03-14 DIAGNOSIS — Z124 Encounter for screening for malignant neoplasm of cervix: Secondary | ICD-10-CM

## 2021-03-14 DIAGNOSIS — Z1159 Encounter for screening for other viral diseases: Secondary | ICD-10-CM

## 2021-03-14 DIAGNOSIS — Z789 Other specified health status: Secondary | ICD-10-CM

## 2021-03-14 DIAGNOSIS — Z13220 Encounter for screening for lipoid disorders: Secondary | ICD-10-CM

## 2021-03-14 DIAGNOSIS — Z1329 Encounter for screening for other suspected endocrine disorder: Secondary | ICD-10-CM

## 2021-03-14 DIAGNOSIS — Z114 Encounter for screening for human immunodeficiency virus [HIV]: Secondary | ICD-10-CM

## 2021-03-14 DIAGNOSIS — N926 Irregular menstruation, unspecified: Secondary | ICD-10-CM

## 2021-03-14 DIAGNOSIS — Z1211 Encounter for screening for malignant neoplasm of colon: Secondary | ICD-10-CM

## 2021-03-14 DIAGNOSIS — Z23 Encounter for immunization: Secondary | ICD-10-CM

## 2021-03-14 DIAGNOSIS — Z1231 Encounter for screening mammogram for malignant neoplasm of breast: Secondary | ICD-10-CM

## 2021-03-14 NOTE — Progress Notes (Signed)
Annual physical exam Tetanus administered

## 2021-03-14 NOTE — Addendum Note (Signed)
Addended by: Margorie John on: 03/14/2021 05:06 PM   Modules accepted: Orders

## 2021-03-14 NOTE — Patient Instructions (Signed)
Preventive Care 84-50 Years Old, Female Preventive care refers to lifestyle choices and visits with your health care provider that can promote health and wellness. This includes:  A yearly physical exam. This is also called an annual wellness visit.  Regular dental and eye exams.  Immunizations.  Screening for certain conditions.  Healthy lifestyle choices, such as: ? Eating a healthy diet. ? Getting regular exercise. ? Not using drugs or products that contain nicotine and tobacco. ? Limiting alcohol use. What can I expect for my preventive care visit? Physical exam Your health care provider will check your:  Height and weight. These may be used to calculate your BMI (body mass index). BMI is a measurement that tells if you are at a healthy weight.  Heart rate and blood pressure.  Body temperature.  Skin for abnormal spots. Counseling Your health care provider may ask you questions about your:  Past medical problems.  Family's medical history.  Alcohol, tobacco, and drug use.  Emotional well-being.  Home life and relationship well-being.  Sexual activity.  Diet, exercise, and sleep habits.  Work and work Statistician.  Access to firearms.  Method of birth control.  Menstrual cycle.  Pregnancy history. What immunizations do I need? Vaccines are usually given at various ages, according to a schedule. Your health care provider will recommend vaccines for you based on your age, medical history, and lifestyle or other factors, such as travel or where you work.   What tests do I need? Blood tests  Lipid and cholesterol levels. These may be checked every 5 years, or more often if you are over 50 years old.  Hepatitis C test.  Hepatitis B test. Screening  Lung cancer screening. You may have this screening every year starting at age 50 if you have a 30-pack-year history of smoking and currently smoke or have quit within the past 15 years.  Colorectal cancer  screening. ? All adults should have this screening starting at age 50 and continuing until age 17. ? Your health care provider may recommend screening at age 50 if you are at increased risk. ? You will have tests every 1-10 years, depending on your results and the type of screening test.  Diabetes screening. ? This is done by checking your blood sugar (glucose) after you have not eaten for a while (fasting). ? You may have this done every 1-3 years.  Mammogram. ? This may be done every 1-2 years. ? Talk with your health care provider about when you should start having regular mammograms. This may depend on whether you have a family history of breast cancer.  BRCA-related cancer screening. This may be done if you have a family history of breast, ovarian, tubal, or peritoneal cancers.  Pelvic exam and Pap test. ? This may be done every 3 years starting at age 50. ? Starting at age 11, this may be done every 5 years if you have a Pap test in combination with an HPV test. Starting at age 50 Other tests  STD (sexually transmitted disease) testing, if you are at risk.  Bone density scan. This is done to screen for osteoporosis. You may have this scan if you are at high risk for osteoporosis. Talk with your health care provider about your test results, treatment options, and if necessary, the need for more tests. Follow these instructions at home: Eating and drinking  Eat a diet that includes fresh fruits and vegetables, whole grains, lean protein, and low-fat dairy products.  Take vitamin and mineral supplements  as recommended by your health care provider.  Do not drink alcohol if: ? Your health care provider tells you not to drink. ? You are pregnant, may be pregnant, or are planning to become pregnant.  If you drink alcohol: ? Limit how much you have to 0-1 drink a day. ? Be aware of how much alcohol is in your drink. In the [place] one drink equals one 12 oz bottle of beer (355 mL), one 5 oz glass of  wine (148 mL), or one 1 oz glass of hard liquor (44 mL).   Lifestyle  Take daily care of your teeth and gums. Brush your teeth every morning and night with fluoride toothpaste. Floss one time each day.  Stay active. Exercise for at least 30 minutes 5 or more days each week.  Do not use any products that contain nicotine or tobacco, such as cigarettes, e-cigarettes, and chewing tobacco. If you need help quitting, ask your health care provider.  Do not use drugs.  If you are sexually active, practice safe sex. Use a condom or other form of protection to prevent STIs (sexually transmitted infections).  If you do not wish to become pregnant, use a form of birth control. If you plan to become pregnant, see your health care provider for a prepregnancy visit.  If told by your health care provider, take low-dose aspirin daily starting at age 50.  Find healthy ways to cope with stress, such as: ? Meditation, yoga, or listening to music. ? Journaling. ? Talking to a trusted person. ? Spending time with friends and family. Safety  Always wear your seat belt while driving or riding in a vehicle.  Do not drive: ? If you have been drinking alcohol. Do not ride with someone who has been drinking. ? When you are tired or distracted. ? While texting.  Wear a helmet and other protective equipment during sports activities.  If you have firearms in your house, make sure you follow all gun safety procedures. What's next?  Visit your health care provider once a year for an annual wellness visit.  Ask your health care provider how often you should have your eyes and teeth checked.  Stay up to date on all vaccines. This information is not intended to replace advice given to you by your health care provider. Make sure you discuss any questions you have with your health care provider. Document Revised: 07/11/2020 Document Reviewed: 06/18/2018 Elsevier Patient Education  2021 Elsevier Inc.  

## 2021-03-15 LAB — HEMOGLOBIN A1C
Est. average glucose Bld gHb Est-mCnc: 97 mg/dL
Hgb A1c MFr Bld: 5 % (ref 4.8–5.6)

## 2021-03-15 LAB — LIPID PANEL
Chol/HDL Ratio: 4.2 ratio (ref 0.0–4.4)
Cholesterol, Total: 257 mg/dL — ABNORMAL HIGH (ref 100–199)
HDL: 61 mg/dL (ref >39)
LDL Chol Calc (NIH): 155 mg/dL — ABNORMAL HIGH (ref 0–99)
Triglycerides: 225 mg/dL — ABNORMAL HIGH (ref 0–149)
VLDL Cholesterol Cal: 41 mg/dL — ABNORMAL HIGH (ref 5–40)

## 2021-03-15 LAB — HIV ANTIBODY (ROUTINE TESTING W REFLEX): HIV Screen 4th Generation wRfx: NONREACTIVE

## 2021-03-15 LAB — TSH: TSH: 0.761 u[IU]/mL (ref 0.450–4.500)

## 2021-03-15 LAB — HEPATITIS C ANTIBODY: Hep C Virus Ab: <0.1 s/co ratio (ref 0.0–0.9)

## 2021-03-15 NOTE — Progress Notes (Signed)
Thyroid function normal.   No diabetes.   Hepatitis C negative.   HIV negative.   Cholesterol higher than expected. High cholesterol may increase risk of heart attack and/or stroke. Consider eating more fruits, vegetables, and lean baked meats such as chicken or fish. Moderate intensity exercise at least 150 minutes as tolerated per week may help as well. Patient encouraged to have rechecked in 3 to 6 months.  The following is for provider reference only: The 10-year ASCVD risk score Denman George DC Montez Hageman., et al., 2013) is: 1.5%   Values used to calculate the score:     Age: 50 years     Sex: Female     Is Non-Hispanic African American: No     Diabetic: No     Tobacco smoker: No     Systolic Blood Pressure: 126 mmHg     Is BP treated: No     HDL Cholesterol: 61 mg/dL     Total Cholesterol: 257 mg/dL

## 2021-03-16 ENCOUNTER — Other Ambulatory Visit: Payer: Self-pay

## 2021-03-16 ENCOUNTER — Ambulatory Visit: Payer: Self-pay | Attending: Family Medicine

## 2021-03-28 ENCOUNTER — Telehealth: Payer: Self-pay | Admitting: Family

## 2021-03-28 NOTE — Telephone Encounter (Signed)
Report to Emergency Department if symptoms worsen or new problems develop.   If remaining stable patient may wait for appointment scheduled with Lyndel Safe, MD at [facility] on 04/05/2021.

## 2021-03-28 NOTE — Telephone Encounter (Signed)
Pt is calling because she states PCP told her to call in 1 week to see how she's been doing. It's been 1 month now w/ her menstruation and it's been heavy except today so far seems less but still wants to update PCP. Pt states she now has obtained the [employer] card and can now get screening done and wants to know if she needs to do anything else at the moment.   Please advise and thank you

## 2021-04-05 ENCOUNTER — Encounter: Payer: Self-pay | Admitting: Family Medicine

## 2021-06-08 ENCOUNTER — Other Ambulatory Visit: Payer: Self-pay

## 2021-06-08 DIAGNOSIS — Z1231 Encounter for screening mammogram for malignant neoplasm of breast: Secondary | ICD-10-CM

## 2021-06-26 ENCOUNTER — Other Ambulatory Visit: Payer: Self-pay | Admitting: Obstetrics and Gynecology

## 2021-06-26 DIAGNOSIS — Z1231 Encounter for screening mammogram for malignant neoplasm of breast: Secondary | ICD-10-CM

## 2021-07-26 ENCOUNTER — Ambulatory Visit
Admission: RE | Admit: 2021-07-26 | Discharge: 2021-07-26 | Disposition: A | Discharge status: Home / Self Care | Payer: No Typology Code available for payment source | Source: Ambulatory Visit | Attending: Family | Admitting: Family

## 2021-07-26 ENCOUNTER — Ambulatory Visit: Payer: Self-pay | Admitting: *Deleted

## 2021-07-26 ENCOUNTER — Other Ambulatory Visit: Payer: Self-pay

## 2021-07-26 VITALS — BP 150/84 | Wt 196.8 lb

## 2021-07-26 DIAGNOSIS — Z1211 Encounter for screening for malignant neoplasm of colon: Secondary | ICD-10-CM

## 2021-07-26 DIAGNOSIS — Z1239 Encounter for other screening for malignant neoplasm of breast: Secondary | ICD-10-CM

## 2021-07-26 DIAGNOSIS — Z1231 Encounter for screening mammogram for malignant neoplasm of breast: Secondary | ICD-10-CM

## 2021-07-26 NOTE — Progress Notes (Signed)
[name] is a 50 y.o. female who presents to [facility] clinic today with no complaints.    Pap Smear: Pap smear not completed today. Last Pap smear was in February 2020 at a clinic in the [place] and was normal per patient. Per patient has no history of an abnormal Pap smear. Last Pap smear result is not available in Epic.   Physical exam: Breasts Breasts symmetrical. Multiple scars observed bilateral lower breasts and a scar observed left breast at 3 o'clock 5 cm from the nipple. Healed sores are hidradenitis appearing that have healed and scarred. Patient states they start as lumps on the skin that open up then heal. No nipple retraction bilateral breasts. No nipple discharge bilateral breasts. No lymphadenopathy. No lumps palpated bilateral breasts. No complaints of pain or tenderness on exam.   Pelvic/Bimanual Pap is not indicated today per BCCCP guidelines.   Smoking History: Patient has never smoked.   Patient Navigation: Patient education provided. Access to services provided for patient through [facility] program. Spanish interpreter Autumn Duncan from [facility] provided.   Colorectal Cancer Screening: Per patient has never had colonoscopy completed. FIT Test given to patient to complete. No complaints today.    Breast and Cervical Cancer Risk Assessment: Patient does not have family history of breast cancer, known genetic mutations, or radiation treatment to the chest before age 82. Patient does not have history of cervical dysplasia, immunocompromised, or DES exposure in-utero.  Risk Assessment     Risk Scores       07/26/2021   Last edited by: Autumn Rutherford, LPN   5-year risk: 0.7 %   Lifetime risk: 6.2 %            A: BCCCP exam without pap smear No complaints.  P: Referred patient to the [facility] for a screening mammogram on mobile unit. Appointment scheduled Thursday, July 26, 2021 at 1400.  Autumn Heidelberg,  RN 07/26/2021 1:25 PM

## 2021-07-26 NOTE — Patient Instructions (Signed)
Explained breast self awareness with [facility]. Patient did not need a Pap smear today due to last Pap smear was in February 2020 per patient. Let her know BCCCP will cover Pap smears every 3 years unless has a history of abnormal Pap smears. Referred patient to the [facility] for a screening mammogram on mobile unit. Appointment scheduled Thursday, July 26, 2021 at 1400. Patient escorted to the mobile unit following BCCCP appointment for her screening mammogram. Let patient know the Breast Center will follow up with her within the next couple weeks with results of her mammogram by letter or phone. Autumn Duncan verbalized understanding.  Godric Lavell, Kathaleen Maser, RN 1:25 PM

## 2021-07-31 NOTE — Progress Notes (Signed)
No malignancy.  Repeat mammogram in 1 year.

## 2022-12-13 ENCOUNTER — Encounter (HOSPITAL_COMMUNITY): Payer: Self-pay

## 2022-12-13 ENCOUNTER — Emergency Department (HOSPITAL_COMMUNITY): Payer: Medicaid Other

## 2022-12-13 ENCOUNTER — Observation Stay (HOSPITAL_COMMUNITY)
Admission: EM | Admit: 2022-12-13 | Discharge: 2022-12-14 | Disposition: A | Discharge status: Home / Self Care | Payer: Medicaid Other | Attending: Family Medicine | Admitting: Family Medicine

## 2022-12-13 ENCOUNTER — Observation Stay (HOSPITAL_COMMUNITY): Payer: Medicaid Other

## 2022-12-13 ENCOUNTER — Other Ambulatory Visit: Payer: Self-pay

## 2022-12-13 DIAGNOSIS — Z23 Encounter for immunization: Secondary | ICD-10-CM | POA: Insufficient documentation

## 2022-12-13 DIAGNOSIS — N920 Excessive and frequent menstruation with regular cycle: Secondary | ICD-10-CM | POA: Diagnosis present

## 2022-12-13 DIAGNOSIS — D649 Anemia, unspecified: Secondary | ICD-10-CM

## 2022-12-13 DIAGNOSIS — R55 Syncope and collapse: Secondary | ICD-10-CM | POA: Diagnosis present

## 2022-12-13 DIAGNOSIS — E66811 Obesity, class 1: Secondary | ICD-10-CM | POA: Diagnosis present

## 2022-12-13 DIAGNOSIS — D500 Iron deficiency anemia secondary to blood loss (chronic): Principal | ICD-10-CM | POA: Insufficient documentation

## 2022-12-13 DIAGNOSIS — E669 Obesity, unspecified: Secondary | ICD-10-CM | POA: Diagnosis present

## 2022-12-13 DIAGNOSIS — Z79899 Other long term (current) drug therapy: Secondary | ICD-10-CM | POA: Insufficient documentation

## 2022-12-13 DIAGNOSIS — Z87898 Personal history of other specified conditions: Secondary | ICD-10-CM

## 2022-12-13 HISTORY — DX: Personal history of other specified conditions: Z87.898

## 2022-12-13 LAB — CBC
HCT: 22.3 % — ABNORMAL LOW (ref 36.0–46.0)
Hemoglobin: 5.4 g/dL — CL (ref 12.0–15.0)
MCH: 16.9 pg — ABNORMAL LOW (ref 26.0–34.0)
MCHC: 24.2 g/dL — ABNORMAL LOW (ref 30.0–36.0)
MCV: 69.7 fL — ABNORMAL LOW (ref 80.0–100.0)
Platelets: 279 10*3/uL (ref 150–400)
RBC: 3.2 MIL/uL — ABNORMAL LOW (ref 3.87–5.11)
RDW: 21.9 % — ABNORMAL HIGH (ref 11.5–15.5)
WBC: 4.4 10*3/uL (ref 4.0–10.5)
nRBC: 0 % (ref 0.0–0.2)

## 2022-12-13 LAB — HEPATIC FUNCTION PANEL
ALT: 13 U/L (ref 0–44)
AST: 23 U/L (ref 15–41)
Albumin: 3.7 g/dL (ref 3.5–5.0)
Alkaline Phosphatase: 55 U/L (ref 38–126)
Bilirubin, Direct: 0.1 mg/dL (ref 0.0–0.2)
Indirect Bilirubin: 0.3 mg/dL (ref 0.3–0.9)
Total Bilirubin: 0.4 mg/dL (ref 0.3–1.2)
Total Protein: 7.5 g/dL (ref 6.5–8.1)

## 2022-12-13 LAB — BASIC METABOLIC PANEL
Anion gap: 6 (ref 5–15)
BUN: 5 mg/dL — ABNORMAL LOW (ref 6–20)
CO2: 20 mmol/L — ABNORMAL LOW (ref 22–32)
Calcium: 8.9 mg/dL (ref 8.9–10.3)
Chloride: 109 mmol/L (ref 98–111)
Creatinine, Ser: 0.73 mg/dL (ref 0.44–1.00)
GFR, Estimated: >60 mL/min (ref >60)
Glucose, Bld: 97 mg/dL (ref 70–99)
Potassium: 3.9 mmol/L (ref 3.5–5.1)
Sodium: 135 mmol/L (ref 135–145)

## 2022-12-13 LAB — PREPARE RBC (CROSSMATCH)

## 2022-12-13 LAB — MAGNESIUM: Magnesium: 2 mg/dL (ref 1.7–2.4)

## 2022-12-13 LAB — TROPONIN I (HIGH SENSITIVITY)
Troponin I (High Sensitivity): 2 ng/L (ref <18)
Troponin I (High Sensitivity): 2 ng/L (ref <18)

## 2022-12-13 LAB — CBG MONITORING, ED: Glucose-Capillary: 90 mg/dL (ref 70–99)

## 2022-12-13 LAB — I-STAT BETA HCG BLOOD, ED (MC, WL, AP ONLY): I-stat hCG, quantitative: <5 m[IU]/mL (ref <5)

## 2022-12-13 LAB — ABO/RH: ABO/RH(D): O POS

## 2022-12-13 MED ORDER — ACETAMINOPHEN 325 MG PO TABS: 650.0000 mg | ORAL_TABLET | Freq: Four times a day (QID) | ORAL | Status: DC | PRN

## 2022-12-13 MED ORDER — SODIUM CHLORIDE 0.9% FLUSH
3.0000 mL | Freq: Two times a day (BID) | INTRAVENOUS | Status: DC
  Administered 2022-12-14: 3 mL via INTRAVENOUS

## 2022-12-13 MED ORDER — ACETAMINOPHEN 650 MG RE SUPP: 650.0000 mg | Freq: Four times a day (QID) | RECTAL | Status: DC | PRN

## 2022-12-13 MED ORDER — INFLUENZA VAC SPLIT QUAD 0.5 ML IM SUSY
0.5000 mL | PREFILLED_SYRINGE | INTRAMUSCULAR | Status: AC
  Administered 2022-12-14: 0.5 mL via INTRAMUSCULAR
  Filled 2022-12-13: qty 0.5

## 2022-12-13 MED ORDER — SODIUM CHLORIDE 0.9% IV SOLUTION: Freq: Once | INTRAVENOUS | Status: AC

## 2022-12-13 MED ORDER — PROCHLORPERAZINE EDISYLATE 10 MG/2ML IJ SOLN: 10.0000 mg | Freq: Four times a day (QID) | INTRAMUSCULAR | Status: DC | PRN

## 2022-12-13 NOTE — ED Triage Notes (Signed)
Pt presents to ED from home C/O LOC yesterday. Reports hitting head on floor. A&O X 4 during triage. Interpreter used for triage.

## 2022-12-13 NOTE — ED Notes (Signed)
EDP notified of Hgb 5.4. Charge RN aware.

## 2022-12-13 NOTE — ED Provider Notes (Signed)
[facility] Provider Note   CSN: [id number] Arrival date & time: 12/13/22  1407     History  Chief Complaint  Patient presents with   Loss of Consciousness    Autumn Duncan is a 52 y.o. female.  52 yo F with a chief complaint of a syncopal event.  The patient was at work and she was doing her job and then suddenly collapsed.  This occurred last night.  Friends talk to her about coming to the hospital today.  She denies any chest pain or difficulty breathing.  She has some left-sided neck pain after the fall.  She feels that she is been eating and drinking normally.  She denies dark stool or blood in her stool.  She did very heavy menstrual cycle that ended about a week ago.  The history is limited by a language barrier. A language interpreter was used.  Loss of Consciousness      Home Medications Prior to Admission medications   Medication Sig Start Date End Date Taking? Authorizing Provider  ibuprofen (ADVIL) 600 MG tablet Take 1 tablet (600 mg total) by mouth every 8 (eight) hours as needed for mild pain or moderate pain. 02/20/21   Camillia Herter, NP  lidocaine (LIDODERM) 5 % Place 1 patch onto the skin daily as needed. Apply patch to area most significant pain once per day.  Remove and discard patch within 12 hours of application. Patient not taking: Reported on 02/20/2021 02/08/21   Petrucelli, Glynda Jaeger, PA-C  methocarbamol (ROBAXIN) 500 MG tablet Take 1 tablet (500 mg total) by mouth every 8 (eight) hours as needed for muscle spasms. Patient not taking: Reported on 02/20/2021 02/08/21   Petrucelli, Glynda Jaeger, PA-C      Allergies    Patient has no known allergies.    Review of Systems   Review of Systems  Cardiovascular:  Positive for syncope.    Physical Exam Updated Vital Signs BP (!) 141/58 (BP Location: Left Arm)   Pulse 89   Temp 98.9 F (37.2 C) (Oral)   Resp 18   Ht '5\' 3"'$  (1.6 m)   Wt 88.5 kg   LMP   (LMP Unknown)   SpO2 100%   BMI 34.54 kg/m  Physical Exam Vitals and nursing note reviewed.  Constitutional:      General: She is not in acute distress.    Appearance: She is well-developed. She is not diaphoretic.  HENT:     Head: Normocephalic and atraumatic.  Eyes:     Pupils: Pupils are equal, round, and reactive to light.  Cardiovascular:     Rate and Rhythm: Normal rate and regular rhythm.     Heart sounds: No murmur heard.    No friction rub. No gallop.  Pulmonary:     Effort: Pulmonary effort is normal.     Breath sounds: No wheezing or rales.  Abdominal:     General: There is no distension.     Palpations: Abdomen is soft.     Tenderness: There is no abdominal tenderness.  Musculoskeletal:        General: No tenderness.     Cervical back: Normal range of motion and neck supple.     Comments: No midline C-spine tenderness.  Able to rotate her head 45 degrees in either direction without pain.  Mild pain over the left trapezius muscle belly.  Skin:    General: Skin is warm and dry.  Neurological:  Mental Status: She is alert and oriented to person, place, and time.  Psychiatric:        Behavior: Behavior normal.     ED Results / Procedures / Treatments   Labs (all labs ordered are listed, but only abnormal results are displayed) Labs Reviewed  BASIC METABOLIC PANEL - Abnormal; Notable for the following components:      Result Value   CO2 20 (*)    BUN 5 (*)    All other components within normal limits  CBC - Abnormal; Notable for the following components:   RBC 3.20 (*)    Hemoglobin 5.4 (*)    HCT 22.3 (*)    MCV 69.7 (*)    MCH 16.9 (*)    MCHC 24.2 (*)    RDW 21.9 (*)    All other components within normal limits  CBG MONITORING, ED  I-STAT BETA HCG BLOOD, ED ([facility], [facility], [facility] ONLY)  PREPARE RBC (CROSSMATCH)  TROPONIN I (HIGH SENSITIVITY)    EKG None  Radiology CT HEAD WO CONTRAST  Result Date: 12/13/2022 CLINICAL DATA:  Syncopal episode, fall,  occipital trauma EXAM: CT HEAD WITHOUT CONTRAST TECHNIQUE: Contiguous axial images were obtained from the base of the skull through the vertex without intravenous contrast. RADIATION DOSE REDUCTION: This exam was performed according to the departmental dose-optimization program which includes automated exposure control, adjustment of the mA and/or kV according to patient size and/or use of iterative reconstruction technique. COMPARISON:  None Available. FINDINGS: Brain: No evidence of acute infarction, hemorrhage, hydrocephalus, extra-axial collection or mass lesion/mass effect. Vascular: No hyperdense vessel or unexpected calcification. Skull: Normal. Negative for fracture or focal lesion. Sinuses/Orbits: No acute finding. Other: None. IMPRESSION: No acute intracranial abnormality by noncontrast CT.  Stable exam. Electronically Signed   By: Jerilynn Mages.  Shick M.D.   On: 12/13/2022 15:27    Procedures .Critical Care  Performed by: Deno Etienne, DO Authorized by: Deno Etienne, DO   Critical care provider statement:    Critical care time (minutes):  35   Critical care time was exclusive of:  Separately billable procedures and treating other patients   Critical care was time spent personally by me on the following activities:  Development of treatment plan with patient or surrogate, discussions with consultants, evaluation of patient's response to treatment, examination of patient, ordering and review of laboratory studies, ordering and review of radiographic studies, ordering and performing treatments and interventions, pulse oximetry, re-evaluation of patient's condition and review of old charts   Care discussed with: admitting provider       Medications Ordered in ED Medications  0.9 %  sodium chloride infusion (Manually program via Guardrails IV Fluids) (has no administration in time range)    ED Course/ Medical Decision Making/ A&P                             Medical Decision Making Amount and/or  Complexity of Data Reviewed Labs: ordered.  Risk Prescription drug management. Decision regarding hospitalization.   52 yo F with a chief complaint of a syncopal event.  This occurred last night while she was at work.  Sounds like it did occur while she was exerting herself.  Her hemoglobin is 5.4.  Could explain her syncopal event.  Sounds like she has been having heavy menstrual cycles recently.  Last 1 ended about a week ago.  Will transfuse 2 units.  Will discuss with medicine for admission.  Troponin  negative, no significant electrolyte abnormality, no significant change to renal function.  Pregnancy test negative.  CT head negative for acute pathology.   The patients results and plan were reviewed and discussed.   Any x-rays performed were independently reviewed by myself.   Differential diagnosis were considered with the presenting HPI.  Medications  0.9 %  sodium chloride infusion (Manually program via Guardrails IV Fluids) (has no administration in time range)    Vitals:   12/13/22 1421 12/13/22 1424  BP: (!) 141/58   Pulse: 89   Resp: 18   Temp: 98.9 F (37.2 C)   TempSrc: Oral   SpO2: 100%   Weight:  88.5 kg  Height:  '5\' 3"'$  (1.6 m)    Final diagnoses:  Symptomatic anemia  Syncope and collapse    Admission/ observation were discussed with the admitting physician, patient and/or family and they are comfortable with the plan.          Final Clinical Impression(s) / ED Diagnoses Final diagnoses:  Symptomatic anemia  Syncope and collapse    Rx / DC Orders ED Discharge Orders     None         Deno Etienne, DO 12/13/22 1554

## 2022-12-13 NOTE — ED Provider Triage Note (Cosign Needed Addendum)
Emergency Medicine Provider Triage Evaluation Note  Autumn Duncan , a 52 y.o. female  was evaluated in triage.  Pt complains of syncope.  Patient states she was at work yesterday when she began to feel warm and lost consciousness and fell and hit her head.  Patient denied any new onset weakness or paresthesias after falling.  Patient also denied vision changes.  Patient stated she did not have any presyncopal chest pain or shortness of breath however after having episode she has been having/sided chest pain that goes up to her left jaw.  Patient denied any blood thinners.  Patient states she has a history of anemia and has received transfusions in the past.  Patient denied dysuria, abdominal pain, seizure, tongue biting, loss of bowel/bladder control, recent hospitalizations/surgeries, hemoptysis, leg swelling  Review of Systems  Positive: See HPI Negative: See HPI  Physical Exam  BP (!) 141/58 (BP Location: Left Arm)   Pulse 89   Temp 98.9 F (37.2 C) (Oral)   Resp 18   Ht '5\' 3"'$  (1.6 m)   Wt 88.5 kg   LMP  (LMP Unknown)   SpO2 100%   BMI 34.54 kg/m  Gen:   Awake, no distress   Resp:  Normal effort  MSK:   Moves extremities without difficulty  Other:  No murmurs rubs or gallops auscultated, lungs clear to auscultation bilaterally, 2+ bilateral radial pulses with regular rate  Medical Decision Making  Medically screening exam initiated at 2:36 PM.  Appropriate orders placed.  Autumn Duncan was informed that the remainder of the evaluation will be completed by another provider, this initial triage assessment does not replace that evaluation, and the importance of remaining in the ED until their evaluation is complete.  Workup initiated, patient is not in distress at this time,      Elvina Sidle 12/13/22 1441

## 2022-12-13 NOTE — H&P (Signed)
History and Physical    Patient: Autumn Duncan [id number] DOB: 05/30/1971 DOA: 12/13/2022 DOS: the patient was seen and examined on 12/13/2022 PCP: Camillia Herter, NP  Patient coming from: Home  Chief Complaint:  Chief Complaint  Patient presents with   Loss of Consciousness   HPI: Autumn Duncan is a 52 y.o. female with medical history significant of menorrhagia, class I obesity, chronic blood loss anemia, history of syncopal episode in the past due to anemia who is coming to the emergency department after having LOC at work and hitting her head.  She was feeling lightheaded, high palpitations and then developed diaphoresis after fall.  She had heard menstrual period last week.  She has heavy menses.  She has had previous GYN workup while in [place] with normal pelvic US. She has been taking iron supplementation on and off.  No fever, chills or night sweats. No sore throat, rhinorrhea, dyspnea, wheezing or hemoptysis.  No chest pain, palpitations, diaphoresis, PND, orthopnea or pitting edema of the lower extremities.  No appetite changes, abdominal pain, diarrhea, constipation, melena or hematochezia.  No flank pain, dysuria, frequency or hematuria.  No polyuria, polydipsia, polyphagia or blurred vision.  ED course: Initial vital signs were temperature 98.9 F, pulse 89, respiration 18, BP 141/58 mmHg O2 sat 100% on room air.  2 units of PRBC were ordered in the emergency department.  Lab work: CBC showed a white count 4.4, hemoglobin 5.4 g/dL with an MCV of 69.7 fL and platelets 279.  BMP showed CO2 of 20 mmol/L and BUN of 5 mg/dL, the rest of the values are unremarkable.  Normal troponin, magnesium, pregnancy test and LFTs.  Imaging: CT head with no acute intracranial abnormality.   Review of Systems: As mentioned in the history of present illness. All other systems reviewed and are negative. Past Medical History:  Diagnosis Date   Anemia    Past Surgical History:   Procedure Laterality Date   CESAREAN SECTION     CHOLECYSTECTOMY     TUBAL LIGATION     Social History:  reports that she has never smoked. She has never used smokeless tobacco. She reports that she does not currently use alcohol after a past usage of about 1.0 - 2.0 standard drink of alcohol per week. She reports that she does not use drugs.  No Known Allergies  Family History  Problem Relation Age of Onset   Thyroid disease Mother     Prior to Admission medications   Medication Sig Start Date End Date Taking? Authorizing Provider  ibuprofen (ADVIL) 600 MG tablet Take 1 tablet (600 mg total) by mouth every 8 (eight) hours as needed for mild pain or moderate pain. 02/20/21   Camillia Herter, NP  lidocaine (LIDODERM) 5 % Place 1 patch onto the skin daily as needed. Apply patch to area most significant pain once per day.  Remove and discard patch within 12 hours of application. Patient not taking: Reported on 02/20/2021 02/08/21   Petrucelli, Glynda Jaeger, PA-C  methocarbamol (ROBAXIN) 500 MG tablet Take 1 tablet (500 mg total) by mouth every 8 (eight) hours as needed for muscle spasms. Patient not taking: Reported on 02/20/2021 02/08/21   Amaryllis Dyke, PA-C    Physical Exam: Vitals:   12/13/22 1421 12/13/22 1424  BP: (!) 141/58   Pulse: 89   Resp: 18   Temp: 98.9 F (37.2 C)   TempSrc: Oral   SpO2: 100%   Weight:  88.5 kg  Height:  '5\' 3"'$  (1.6 m)   Physical Exam Vitals and nursing note reviewed.  Constitutional:      Appearance: Normal appearance. She is obese.  HENT:     Head: Normocephalic.     Mouth/Throat:     Mouth: Mucous membranes are moist.  Eyes:     General: No scleral icterus.    Pupils: Pupils are equal, round, and reactive to light.  Cardiovascular:     Rate and Rhythm: Normal rate and regular rhythm.  Pulmonary:     Effort: Pulmonary effort is normal.     Breath sounds: Normal breath sounds.  Abdominal:     General: Bowel sounds are normal. There is  no distension.     Palpations: Abdomen is soft.     Tenderness: There is no abdominal tenderness. There is no guarding.  Musculoskeletal:     Cervical back: Neck supple.     Right lower leg: No edema.     Left lower leg: No edema.  Skin:    General: Skin is warm and dry.  Neurological:     General: No focal deficit present.     Mental Status: She is alert and oriented to person, place, and time.  Psychiatric:        Mood and Affect: Mood normal.        Behavior: Behavior normal.   Data Reviewed:  Results are pending, will review when available.  EKG: Vent. 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 Observation/telemetry. Continue IV fluids. Check two-view chest radiograph. Check carotid Doppler. Check echocardiogram. Will need regular iron supplementation.  Active Problems:   Menorrhagia Follow-up with GYN as an outpatient. Consider treatment with a progestin.    Class 1 obesity Current BMI 30.42 kg/m. Lifestyle modifications. Follow-up with primary care provider.    Advance Care Planning:   Code Status: Full Code   Consults:   Family Communication:   Severity of Illness: The appropriate patient status for this patient is OBSERVATION. Observation status is judged to be reasonable and necessary in order to provide the required intensity of service to ensure the patient's safety. The patient's presenting symptoms, physical exam findings, and initial radiographic and laboratory data in the context of their medical condition is felt to place them at decreased risk for further clinical deterioration. Furthermore, it is anticipated that the patient will be medically stable for discharge from the hospital within 2 midnights of admission.   Author: Reubin Milan, MD 12/13/2022 3:56 PM  For on call review  [email or web address].   This document was prepared using Dragon voice recognition software and may contain some unintended transcription errors.

## 2022-12-13 NOTE — ED Notes (Signed)
Notified charge that patient should not be in the hallway. Without acceptable monitoring.

## 2022-12-13 NOTE — Plan of Care (Signed)

## 2022-12-14 ENCOUNTER — Observation Stay (HOSPITAL_COMMUNITY): Payer: No Typology Code available for payment source

## 2022-12-14 ENCOUNTER — Observation Stay (HOSPITAL_BASED_OUTPATIENT_CLINIC_OR_DEPARTMENT_OTHER): Payer: Medicaid Other

## 2022-12-14 DIAGNOSIS — R55 Syncope and collapse: Secondary | ICD-10-CM

## 2022-12-14 LAB — TYPE AND SCREEN
ABO/RH(D): O POS
Antibody Screen: NEGATIVE
Unit division: 0
Unit division: 0

## 2022-12-14 LAB — BPAM RBC
Blood Product Expiration Date: 202403222359
Blood Product Expiration Date: 202403222359
ISSUE DATE / TIME: 202402231840
ISSUE DATE / TIME: 202402232229
Unit Type and Rh: 5100
Unit Type and Rh: 5100

## 2022-12-14 LAB — CBC
HCT: 27.2 % — ABNORMAL LOW (ref 36.0–46.0)
Hemoglobin: 7.7 g/dL — ABNORMAL LOW (ref 12.0–15.0)
MCH: 20.5 pg — ABNORMAL LOW (ref 26.0–34.0)
MCHC: 28.3 g/dL — ABNORMAL LOW (ref 30.0–36.0)
MCV: 72.3 fL — ABNORMAL LOW (ref 80.0–100.0)
Platelets: 214 10*3/uL (ref 150–400)
RBC: 3.76 MIL/uL — ABNORMAL LOW (ref 3.87–5.11)
RDW: 23.8 % — ABNORMAL HIGH (ref 11.5–15.5)
WBC: 5.7 10*3/uL (ref 4.0–10.5)
nRBC: 0 % (ref 0.0–0.2)

## 2022-12-14 LAB — GLUCOSE, CAPILLARY: Glucose-Capillary: 89 mg/dL (ref 70–99)

## 2022-12-14 LAB — HIV ANTIBODY (ROUTINE TESTING W REFLEX)
HIV Screen 4th Generation wRfx: NONREACTIVE
HIV Screen 4th Generation wRfx: NONREACTIVE

## 2022-12-14 MED ORDER — FERROUS SULFATE 325 (65 FE) MG PO TBEC: 325.0000 mg | DELAYED_RELEASE_TABLET | Freq: Two times a day (BID) | ORAL | 3 refills | Status: AC

## 2022-12-14 MED ORDER — MEDROXYPROGESTERONE ACETATE 150 MG/ML IM SUSP
300.0000 mg | Freq: Once | INTRAMUSCULAR | Status: DC
  Filled 2022-12-14: qty 2

## 2022-12-14 MED ORDER — MEDROXYPROGESTERONE ACETATE 150 MG/ML IM SUSP
150.0000 mg | Freq: Once | INTRAMUSCULAR | Status: AC
  Administered 2022-12-14: 150 mg via INTRAMUSCULAR
  Filled 2022-12-14: qty 1

## 2022-12-14 MED ORDER — SODIUM CHLORIDE 0.9 % IV SOLN
250.0000 mg | Freq: Once | INTRAVENOUS | Status: AC
  Administered 2022-12-14: 250 mg via INTRAVENOUS
  Filled 2022-12-14: qty 20

## 2022-12-14 MED ORDER — DOCUSATE SODIUM 100 MG PO CAPS: 100.0000 mg | ORAL_CAPSULE | Freq: Every day | ORAL | 2 refills | Status: AC | PRN

## 2022-12-14 NOTE — Discharge Summary (Signed)
Physician Discharge Summary   Patient: Autumn Duncan MRN: [id number] DOB: 1971-03-24  Admit date:     12/13/2022  Discharge date: 12/14/22  Discharge Physician: Edwin Dada   PCP: Camillia Herter, NP     Recommendations at discharge:  Follow up with PCP Amy Minette Brine in 1 week for menorrhagia and anemia [facility] or [facility]:  Check Hgb in 1 week Check iron studies in 8-12 weeks Refer to OB-Gyn for menorrhagia     Discharge Diagnoses: Principal Problem:   Syptomatic anemia due to menorrhagia Active Problems:   Menorrhagia   Class 1 obesity     Hospital Course: Autumn Duncan is a 52 y.o. F with hx obesity and menorrhagia requiring transfusion in [place].  Has been doing well in the 2-3 years since she moved here, but has had heavy periods.  In the last few weeks/month, has had progressive dyspnea on exertion and fatigue, finally passed out, so came to the ER.     Symptomatic anemia due to iron deficiency due to chronic menstrual bleeding Admitted and transfused 1 unit of blood.  Iron studies obtained and severely low.  Given IV iron.     Menstrual bleeding discussed with obstetrics who recommended one-time dose of Depo-Provera and outpatient follow-up.  Imaging to be determined at OB/GYN follow-up.  Discharged on oral iron.    Syncope Due to anemia.             The [facility] Controlled Substances Registry was reviewed for this patient prior to discharge.  Consultants: None Procedures performed: None  Disposition: Home   DISCHARGE MEDICATION: Allergies as of 12/14/2022   No Known Allergies      Medication List     STOP taking these medications    lidocaine 5 % Commonly known as: Lidoderm   methocarbamol 500 MG tablet Commonly known as: ROBAXIN       TAKE these medications    docusate sodium 100 MG capsule Commonly known as: Colace Take 1 capsule (100 mg total) by mouth daily as needed.    ferrous sulfate 325 (65 FE) MG EC tablet Take 1 tablet (325 mg total) by mouth 2 (two) times daily.   ibuprofen 600 MG tablet Commonly known as: ADVIL Take 1 tablet (600 mg total) by mouth every 8 (eight) hours as needed for mild pain or moderate pain.        Follow-up Information     Camillia Herter, NP. Schedule an appointment as soon as possible for a visit in 1 week(s).   Specialty: Nurse Practitioner Contact information: [address] [place] [address] [phone]                 Discharge Instructions     Discharge instructions   Complete by: As directed    **IMPORTANT DISCHARGE INSTRUCTIONS**   From Dr. Loleta Books: You were admitted for passing out  Here, we found this was from anemia (low blood levels)  Your anemia is from heavy menstrual periods.  When you lose more than a normal amount of blood, your body runs out of iron and can't make new blood.  Here, we gave you a blood transfusion to help the symptoms of anemia  Then we gave you an intravenous iron treatment (to get you started replacing your iron) You should take iron pills daily: Take ferrous sulfate 325 mg twice daily for the next 3 months Have your primary doctor check your iron levels in 3  months Iron (ferrous sulfate) can make you constipated, so take a stool softener with it (docusate or Colace 100 mg once or twice daily)  Lastly, we gave you a Depo shot, which is a hormone to slow the bleeding.  Go see your primary in 1 week and ask for a referral to an OB-Gyn specialist   Increase activity slowly   Complete by: As directed        Discharge Exam: Filed Weights   12/13/22 1424 12/13/22 1644 12/14/22 0441  Weight: 88.5 kg 77.9 kg 76.2 kg    General: Pt is alert, awake, not in acute distress Cardiovascular: RRR, nl S1-S2, no murmurs appreciated.   No LE edema.   Respiratory: Normal respiratory rate and rhythm.  CTAB without rales or wheezes. Abdominal: Abdomen  soft and non-tender.  No distension or HSM.   Neuro/Psych: Strength symmetric in upper and lower extremities.  Judgment and insight appear normal.   Condition at discharge: good  The results of significant diagnostics from this hospitalization (including imaging, microbiology, ancillary and laboratory) are listed below for reference.   Imaging Studies: VAS US CAROTID  Result Date: 12/14/2022 Carotid Arterial Duplex Study Patient Name:  Autumn Duncan  Date of Exam:   12/14/2022 Medical Rec #: [id number]                Accession #:    [id number] Date of Birth: June 12, 1971                Patient Gender: F Patient Age:   44 years Exam Location:  [facility] Procedure:      VAS US CAROTID Referring Phys: DAVID ORTIZ --------------------------------------------------------------------------------  Indications:       Syncope. Comparison Study:  No prior studies. Performing Technologist: Darlin Coco RDMS, RVT  Examination Guidelines: A complete evaluation includes B-mode imaging, spectral Doppler, color Doppler, and power Doppler as needed of all accessible portions of each vessel. Bilateral testing is considered an integral part of a complete examination. Limited examinations for reoccurring indications may be performed as noted.  Right Carotid Findings: +----------+--------+--------+--------+------------------+------------------+           PSV cm/sEDV cm/sStenosisPlaque DescriptionComments           +----------+--------+--------+--------+------------------+------------------+ CCA Prox  72      14                                                   +----------+--------+--------+--------+------------------+------------------+ CCA Distal91      23                                                   +----------+--------+--------+--------+------------------+------------------+ ICA Prox  61      29                                intimal thickening  +----------+--------+--------+--------+------------------+------------------+ ICA Distal104     40                                tortuous           +----------+--------+--------+--------+------------------+------------------+ ECA  101     14                                                   +----------+--------+--------+--------+------------------+------------------+ +----------+--------+-------+----------------+-------------------+           PSV cm/sEDV cmsDescribe        Arm Pressure (mmHG) +----------+--------+-------+----------------+-------------------+ [id number]            Multiphasic, WNL                    +----------+--------+-------+----------------+-------------------+ +---------+--------+--+--------+--+---------+ VertebralPSV cm/s94EDV cm/s31Antegrade +---------+--------+--+--------+--+---------+  Left Carotid Findings: +----------+--------+--------+--------+------------------+------------------+           PSV cm/sEDV cm/sStenosisPlaque DescriptionComments           +----------+--------+--------+--------+------------------+------------------+ CCA Prox  99      25                                                   +----------+--------+--------+--------+------------------+------------------+ CCA Distal83      27                                                   +----------+--------+--------+--------+------------------+------------------+ ICA Prox  71      30                                intimal thickening +----------+--------+--------+--------+------------------+------------------+ ICA Distal143     58                                tortuous           +----------+--------+--------+--------+------------------+------------------+ ECA       82      17                                                   +----------+--------+--------+--------+------------------+------------------+  +----------+--------+--------+----------------+-------------------+           PSV cm/sEDV cm/sDescribe        Arm Pressure (mmHG) +----------+--------+--------+----------------+-------------------+ Subclavian110             Multiphasic, WNL                    +----------+--------+--------+----------------+-------------------+ +---------+--------+--+--------+--+---------+ VertebralPSV cm/s59EDV cm/s16Antegrade +---------+--------+--+--------+--+---------+   Summary: Right Carotid: The extracranial vessels were near-normal with only minimal wall                thickening or plaque. Left Carotid: The extracranial vessels were near-normal with only minimal wall               thickening or plaque. Vertebrals:  Bilateral vertebral arteries demonstrate antegrade flow. Subclavians: Normal flow hemodynamics were seen in bilateral subclavian              arteries. *See table(s) above for measurements and observations.     Preliminary  X-ray chest PA and lateral  Result Date: 12/13/2022 CLINICAL DATA:  Syncopal episode. EXAM: CHEST - 2 VIEW COMPARISON:  02/08/2021 FINDINGS: The heart size and mediastinal contours are within normal limits. Both lungs are clear. The visualized skeletal structures are unremarkable. IMPRESSION: No active cardiopulmonary disease. Electronically Signed   By: Marlaine Hind M.D.   On: 12/13/2022 18:21   CT HEAD WO CONTRAST  Result Date: 12/13/2022 CLINICAL DATA:  Syncopal episode, fall, occipital trauma EXAM: CT HEAD WITHOUT CONTRAST TECHNIQUE: Contiguous axial images were obtained from the base of the skull through the vertex without intravenous contrast. RADIATION DOSE REDUCTION: This exam was performed according to the departmental dose-optimization program which includes automated exposure control, adjustment of the mA and/or kV according to patient size and/or use of iterative reconstruction technique. COMPARISON:  None Available. FINDINGS: Brain: No evidence of acute  infarction, hemorrhage, hydrocephalus, extra-axial collection or mass lesion/mass effect. Vascular: No hyperdense vessel or unexpected calcification. Skull: Normal. Negative for fracture or focal lesion. Sinuses/Orbits: No acute finding. Other: None. IMPRESSION: No acute intracranial abnormality by noncontrast CT.  Stable exam. Electronically Signed   By: Jerilynn Mages.  Shick M.D.   On: 12/13/2022 15:27    Microbiology: No results found for this or any previous visit.  Labs: CBC: Recent Labs  Lab 12/13/22 1446 12/14/22 0448  WBC 4.4 5.7  HGB 5.4* 7.7*  HCT 22.3* 27.2*  MCV 69.7* 72.3*  PLT 279 [id number]   Basic Metabolic Panel: Recent Labs  Lab 12/13/22 1446 12/13/22 1532  NA 135  --   K 3.9  --   CL 109  --   CO2 20*  --   GLUCOSE 97  --   BUN 5*  --   CREATININE 0.73  --   CALCIUM 8.9  --   MG  --  2.0   Liver Function Tests: Recent Labs  Lab 12/13/22 1532  AST 23  ALT 13  ALKPHOS 55  BILITOT 0.4  PROT 7.5  ALBUMIN 3.7   CBG: Recent Labs  Lab 12/13/22 1418 12/14/22 0446  GLUCAP 90 89    Discharge time spent: approximately 35 minutes spent on discharge counseling, evaluation of patient on day of discharge, and coordination of discharge planning with nursing, social work, pharmacy and case management  Signed: Edwin Dada, MD [facility] 12/14/2022

## 2022-12-14 NOTE — Discharge Instructions (Signed)
Patient given list of [place]

## 2022-12-14 NOTE — Progress Notes (Signed)
Carotid duplex bilateral study completed.   Please see CV Proc for preliminary results.   Victorian Gunn, RDMS, RVT  

## 2022-12-14 NOTE — TOC Initial Note (Signed)
Transition of Care [facility]) - Initial/Assessment Note    Patient Details  Name: Autumn Duncan MRN: [id number] Date of Birth: 02-16-1971  Transition of [facility]) CM/SW Contact:    Henrietta Dine, RN Phone Number: 12/14/2022, 12:20 PM  Clinical Narrative:                 [facility] consult for PCP/Insurance; spoke w/ pt and family in home; pt is from home and she plans to return at d/c; she has transportation; her POC is Ted Mcalpine (son) [phone]; she denies food insecurity, difficulty paying utilities, and IPV; pt does not have glasses, dentures, HA, DME, HH services or home oxygen; pt and family agree to receiving resources; they were given list of [place] and Guide to Social Services; Guide to [employer] also placed in d/c instructions; pt/ family will make her appts at agencies; no TOC needs.  Expected Discharge Plan: Home/Self Care Barriers to Discharge: No Barriers Identified   Patient Goals and CMS Choice Patient states their goals for this hospitalization and ongoing recovery are:: home          Expected Discharge Plan and Services   Discharge Planning Services: CM Consult Post Acute Care Choice: NA Living arrangements for the past 2 months: Apartment Expected Discharge Date: 12/14/22                                    Prior Living Arrangements/Services Living arrangements for the past 2 months: Apartment Lives with:: Self Patient language and need for interpreter reviewed:: Yes Do you feel safe going back to the place where you live?: Yes      Need for Family Participation in Patient Care: Yes (Comment) Care giver support system in place?: Yes (comment) Current home services:  (n/a) Criminal Activity/Legal Involvement Pertinent to Current Situation/Hospitalization: No - Comment as needed  Activities of Daily Living Home Assistive Devices/Equipment: None ADL Screening (condition at time of admission) Patient's  cognitive ability adequate to safely complete daily activities?: Yes Is the patient deaf or have difficulty hearing?: No Does the patient have difficulty seeing, even when wearing glasses/contacts?: Yes (not major) Does the patient have difficulty concentrating, remembering, or making decisions?: No Patient able to express need for assistance with ADLs?: Yes Does the patient have difficulty dressing or bathing?: No Independently performs ADLs?: Yes (appropriate for developmental age) Does the patient have difficulty walking or climbing stairs?: Yes (knees hurt and gets tired easily) Weakness of Legs: Both Weakness of Arms/Hands: None  Permission Sought/Granted Permission sought to share information with : Case Manager Permission granted to share information with : Yes, Verbal Permission Granted  Share Information with NAME: Lenor Coffin, RN, CM     Permission granted to share info w Relationship: Ted Mcalpine (son) [phone]     Emotional Assessment Appearance:: Appears stated age Attitude/Demeanor/Rapport: Gracious Affect (typically observed): Accepting Orientation: : Oriented to Self, Oriented to Place, Oriented to  Time, Oriented to Situation Alcohol / Substance Use: Not Applicable Psych Involvement: Yes (comment)  Admission diagnosis:  Syncope and collapse [R55] Symptomatic anemia [D64.9] Patient Active Problem List   Diagnosis Date Noted   Syncope and collapse 12/13/2022   Menorrhagia 12/13/2022   Class 1 obesity 12/13/2022   PCP:  Camillia Herter, NP Pharmacy:   [facility] ([facility]), [place] [id number] [place] ([place]) [facility]  [id number] Phone: [phone] Fax: [phone]     Social Determinants of Health (SDOH) Social History: [place]: No Food Insecurity (12/14/2022)  Housing: Low Risk  (12/14/2022)  Transportation Needs: No Transportation Needs (12/14/2022)  Utilities: Not At Risk  (12/14/2022)  Depression (PHQ2-9): Low Risk  (03/14/2021)  Tobacco Use: Low Risk  (12/13/2022)   SDOH Interventions: Food Insecurity Interventions: Inpatient TOC Housing Interventions: Inpatient TOC Transportation Interventions: Inpatient TOC Utilities Interventions: Inpatient TOC   Readmission Risk Interventions     No data to display

## 2022-12-16 ENCOUNTER — Telehealth: Payer: Self-pay

## 2022-12-16 ENCOUNTER — Telehealth: Payer: Self-pay | Admitting: Family

## 2022-12-16 ENCOUNTER — Telehealth: Payer: Self-pay | Admitting: Emergency Medicine

## 2022-12-16 NOTE — Telephone Encounter (Signed)
Pt is calling to hospital follow up. The soonest appt is Friday December 20, 2022 at 10:00a with Dr. Redmond Pulling. Attempted to call the office 3 times to see if this was ok. There was no answer. Please advise with the patient if the appt needs to be rescheduled. CB- [phone]

## 2022-12-16 NOTE — Telephone Encounter (Signed)
Copied from [place] [id number]. Topic: General - Other >> Dec 16, 2022  1:31 PM Everette C wrote: Reason for CRM: The patient has returned a missed call from [place]   Please contact further when possible

## 2022-12-16 NOTE — Telephone Encounter (Signed)
Pt can be seen by provider that they are scheduled with

## 2022-12-16 NOTE — Transitions of Care (Post Inpatient/ED Visit) (Signed)
   12/16/2022  Name: Autumn Duncan MRN: [id number] DOB: 1970/12/27   Call completed with assistance of Spanish interpreter:[id number]/[employer].   Today's TOC FU Call Status: Today's TOC FU Call Status:: Successful TOC FU Call Competed Unsuccessful Call (1st Attempt) Date: 12/16/22 [facility] FU Call Complete Date: 12/16/22  Transition Care Management Follow-up Telephone Call Date of Discharge: 12/14/22 Discharge Facility: [facility]) Type of Discharge: Inpatient Admission Primary Inpatient Discharge Diagnosis:: symptomatic anemia How have you been since you were released from the hospital?: Better Any questions or concerns?: Yes Patient Questions/Concerns:: She explained that when she lays down she gets very hot and starts sweating. she does not have a fever and this has not happened before. She just wanted her provider to know.    She will need referral to OB/GYN Patient Questions/Concerns Addressed: Notified Provider of Patient Questions/Concerns  Items Reviewed: Did you receive and understand the discharge instructions provided?: Yes (She said she did not understand them; but her daughter in law  understands the instructions and explained them to her) Medications obtained and verified?: Yes (Medications Reviewed) Any new allergies since your discharge?: No Dietary orders reviewed?: Yes Type of Diet Ordered:: heart healthy Do you have support at home?: Yes People in Home: child(ren), adult, sibling(s) Name of Support/Comfort Primary Source: her sister, son and daughter  in law.  Home Care and Equipment/Supplies: Were Home Health Services Ordered?: No Any new equipment or medical supplies ordered?: No  Functional Questionnaire: Do you need assistance with bathing/showering or dressing?: No Do you need assistance with meal preparation?: No Do you need assistance with eating?: No Do you have difficulty maintaining continence: No Do you need assistance with getting  out of bed/getting out of a chair/moving?: No Do you have difficulty managing or taking your medications?: No  Folllow up appointments reviewed: PCP Follow-up appointment confirmed?: Yes Date of PCP follow-up appointment?: 12/20/22 Follow-up Provider: Dr [facility] Follow-up appointment confirmed?: NA (She needs a referral to OB/GYN) Do you need transportation to your follow-up appointment?: No Do you understand care options if your condition(s) worsen?: Yes-patient verbalized understanding    SIGNATURE  Eden Lathe, RN

## 2022-12-16 NOTE — Telephone Encounter (Signed)
From the discharge call:  She said she is feeling better. Her only concern it that when she lays down she gets very hot and starts sweating. she does not have a fever and this has not happened before. She just wanted her provider to know.    She will need referral to OB/GYN  She has her medications and has an appointment with Dr Redmond Pulling - 3.1.2024.

## 2022-12-16 NOTE — Transitions of Care (Post Inpatient/ED Visit) (Signed)
   12/16/2022  Name: Autumn Duncan MRN: [id number] DOB: January 07, 1971  Today's TOC FU Call Status: Today's TOC FU Call Status:: Unsuccessul Call (1st Attempt) Unsuccessful Call (1st Attempt) Date: 12/16/22  Call placed with assistance of Spanish Interpreter [id number]/[employer]  Attempted to reach the patient regarding the most recent Inpatient/ED visit.  Follow Up Plan: Additional outreach attempts will be made to reach the patient to complete the Transitions of Care (Post Inpatient/ED visit) call.   Signature Eden Lathe, RN

## 2022-12-16 NOTE — Telephone Encounter (Signed)
Call returned to patient and documented in a TOC encounter

## 2022-12-17 NOTE — Telephone Encounter (Signed)
Noted  

## 2022-12-20 ENCOUNTER — Ambulatory Visit (INDEPENDENT_AMBULATORY_CARE_PROVIDER_SITE_OTHER): Payer: Self-pay | Admitting: Family Medicine

## 2022-12-20 ENCOUNTER — Encounter: Payer: Self-pay | Admitting: Family Medicine

## 2022-12-20 VITALS — BP 132/81 | HR 61 | Temp 98.1°F | Resp 16 | Ht 66.0 in | Wt 171.8 lb

## 2022-12-20 DIAGNOSIS — D649 Anemia, unspecified: Secondary | ICD-10-CM

## 2022-12-20 DIAGNOSIS — N920 Excessive and frequent menstruation with regular cycle: Secondary | ICD-10-CM

## 2022-12-20 DIAGNOSIS — Z789 Other specified health status: Secondary | ICD-10-CM

## 2022-12-21 LAB — CBC WITH DIFFERENTIAL/PLATELET
Basophils Absolute: 0.1 10*3/uL (ref 0.0–0.2)
Basos: 1 %
EOS (ABSOLUTE): 0.1 10*3/uL (ref 0.0–0.4)
Eos: 2 %
Hematocrit: 35.1 % (ref 34.0–46.6)
Hemoglobin: 10.1 g/dL — ABNORMAL LOW (ref 11.1–15.9)
Immature Grans (Abs): 0 10*3/uL (ref 0.0–0.1)
Immature Granulocytes: 0 %
Lymphocytes Absolute: 2 10*3/uL (ref 0.7–3.1)
Lymphs: 33 %
MCH: 22.1 pg — ABNORMAL LOW (ref 26.6–33.0)
MCHC: 28.8 g/dL — ABNORMAL LOW (ref 31.5–35.7)
MCV: 77 fL — ABNORMAL LOW (ref 79–97)
Monocytes Absolute: 0.5 10*3/uL (ref 0.1–0.9)
Monocytes: 9 %
Neutrophils Absolute: 3.4 10*3/uL (ref 1.4–7.0)
Neutrophils: 55 %
Platelets: 291 10*3/uL (ref 150–450)
RBC: 4.56 x10E6/uL (ref 3.77–5.28)
RDW: 28.6 % — ABNORMAL HIGH (ref 11.7–15.4)
WBC: 6.1 10*3/uL (ref 3.4–10.8)

## 2022-12-23 ENCOUNTER — Encounter: Payer: Self-pay | Admitting: Family Medicine

## 2022-12-23 NOTE — Progress Notes (Signed)
Established Patient Office Visit  Subjective    Patient ID: Autumn Duncan, female    DOB: [birth year]  Age: 52 y.o. MRN: [id number]  CC:  Chief Complaint  Patient presents with   Follow-up    HFU    HPI [place] presents with complaint of heavy menstrual bleeding with anemia. This visit was aided by an interpreter.    Outpatient Encounter Medications as of 12/20/2022  Medication Sig   docusate sodium (COLACE) 100 MG capsule Take 1 capsule (100 mg total) by mouth daily as needed.   ferrous sulfate 325 (65 FE) MG EC tablet Take 1 tablet (325 mg total) by mouth 2 (two) times daily.   ibuprofen (ADVIL) 600 MG tablet Take 1 tablet (600 mg total) by mouth every 8 (eight) hours as needed for mild pain or moderate pain.   No facility-administered encounter medications on file as of 12/20/2022.    Past Medical History:  Diagnosis Date   Anemia     Past Surgical History:  Procedure Laterality Date   CESAREAN SECTION     CHOLECYSTECTOMY     TUBAL LIGATION      Family History  Problem Relation Age of Onset   Thyroid disease Mother     Social History   Socioeconomic History   Marital status: Single    Spouse name: Not on file   Number of children: 3   Years of education: Not on file   Highest education level: 10th grade  Occupational History   Not on file  Tobacco Use   Smoking status: Never   Smokeless tobacco: Never  Vaping Use   Vaping Use: Never used  Substance and Sexual Activity   Alcohol use: Not Currently    Alcohol/week: 1.0 - 2.0 standard drink of alcohol    Types: 1 - 2 Cans of beer per week   Drug use: Never   Sexual activity: Yes    Birth control/protection: Surgical  Other Topics Concern   Not on file  Social History Narrative   Not on file   Social Determinants of Health   Financial Resource Strain: Not on file  Food Insecurity: No Food Insecurity (12/14/2022)   Hunger Vital Sign    Worried About Running Out of Food in  the Last Year: Never true    Ran Out of Food in the Last Year: Never true  Transportation Needs: No Transportation Needs (12/14/2022)   PRAPARE - Hydrologist (Medical): No    Lack of Transportation (Non-Medical): No  Physical Activity: Not on file  Stress: Not on file  Social Connections: Not on file  Intimate Partner Violence: Not At Risk (12/14/2022)   Humiliation, Afraid, Rape, and Kick questionnaire    Fear of Current or Ex-Partner: No    Emotionally Abused: No    Physically Abused: No    Sexually Abused: No    Review of Systems  All other systems reviewed and are negative.       Objective    BP 132/81   Pulse 61   Temp 98.1 F (36.7 C) (Oral)   Resp 16   Ht '5\' 6"'$  (1.676 m)   Wt 171 lb 12.8 oz (77.9 kg)   LMP  (LMP Unknown)   SpO2 95%   BMI 27.73 kg/m   Physical Exam Vitals and nursing note reviewed.  Constitutional:      General: She is not in acute distress. Cardiovascular:     Rate  and Rhythm: Normal rate and regular rhythm.  Pulmonary:     Effort: Pulmonary effort is normal.     Breath sounds: Normal breath sounds.  Abdominal:     Palpations: Abdomen is soft.     Tenderness: There is no abdominal tenderness.  Neurological:     General: No focal deficit present.     Mental Status: She is alert and oriented to person, place, and time.         Assessment & Plan:   1. Anemia, unspecified type Referral to gyn for further eval/mgt - CBC with Differential  2. Menorrhagia with regular cycle As as above  3. Language barrier to communication      Return if symptoms worsen or fail to improve.   Becky Sax, MD

## 2023-01-01 ENCOUNTER — Encounter: Payer: Self-pay | Admitting: *Deleted

## 2023-01-01 ENCOUNTER — Ambulatory Visit: Payer: Self-pay | Admitting: *Deleted

## 2023-01-01 ENCOUNTER — Encounter (INDEPENDENT_AMBULATORY_CARE_PROVIDER_SITE_OTHER): Payer: Self-pay | Admitting: Primary Care

## 2023-01-01 ENCOUNTER — Encounter (INDEPENDENT_AMBULATORY_CARE_PROVIDER_SITE_OTHER): Payer: Self-pay

## 2023-01-01 NOTE — Telephone Encounter (Signed)
  Chief Complaint: heavy prolonged menstrual cycle  Symptoms: heavy bleeding since 12/22/22- hx blood transfusion Frequency: patient reports her cycle comes every month lasting at least 2 weeks  Disposition: [] ED /[] Urgent Care (no appt availability in office) / [x] Appointment(In office/virtual)/ []  [facility]/ [] Home Care/ [] Refused Recommended Disposition /[] [facility]/ []  Follow-up with PCP Additional Notes: Message to [facility]- appointment has been scheduled at [facility] for patient- will message the address.

## 2023-01-01 NOTE — Telephone Encounter (Signed)
Patient call from Autumn Duncan. Patient requesting appointment for today Re: heavy bleeding appointment made at [facility].

## 2023-01-01 NOTE — Telephone Encounter (Signed)
Interpreter: [id number] Reason for Disposition  MODERATE vaginal bleeding (e.g., soaking 1 pad or tampon per hour and present > 6 hours; 1 menstrual cup every 6 hours)  Answer Assessment - Initial Assessment Questions 1. MAIN SYMPTOM: "What is your main symptom?" "How bad is it?"  (e.g., difficulty concentrating, hot flashes, fatigue, irregular periods, vaginal dryness)     Heavy menstrual bleeding 2. ONSET: "When did the bleeding begin?" (e.g., hours, days or weeks ago)     12/22/22 3. MENOPAUSE: "When was your last menstrual period?"     Usually has very long cycles- lasting 2 weeks 4. MEDICINES: "Are you taking any hormone prescription or over-the-counter medicines?" (e.g., estrogen; estrogen/progesterone, herbal supplements, medicines for mood, new medicines, Niacin)     No- iron, stool softener 5. OTHER SYMPTOMS: "Do you have any other symptoms?" (e.g., insomnia, mood swings)     Left abdominal pain- area comes and goes, dizzy and weak 6. CALLER'S COPING: "How are you doing?" (e.g., anxiety, depression, emotional state with menopause symptoms)     Symptoms of heavy bleeding- patient has hx of blood transfusion 7. TREATMENTS AND RESPONSE: "What have you done so far to try to make this better?"     Only iron supplement  Protocols used: Menopause Symptoms and Questions-A-AH, Vaginal Bleeding - Abnormal-A-AH

## 2023-01-02 IMAGING — MG MM DIGITAL SCREENING BILAT W/ TOMO AND CAD
8 series · 8 of 24 positions shown · non-contrast
Comparison: None.

CLINICAL DATA: Screening.

EXAM:
DIGITAL SCREENING BILATERAL MAMMOGRAM WITH TOMOSYNTHESIS AND CAD
TECHNIQUE: Bilateral screening digital craniocaudal and mediolateral oblique
mammograms were obtained. Bilateral screening digital breast
tomosynthesis was performed. The images were evaluated with
computer-aided detection.

[R CC synth-2D]
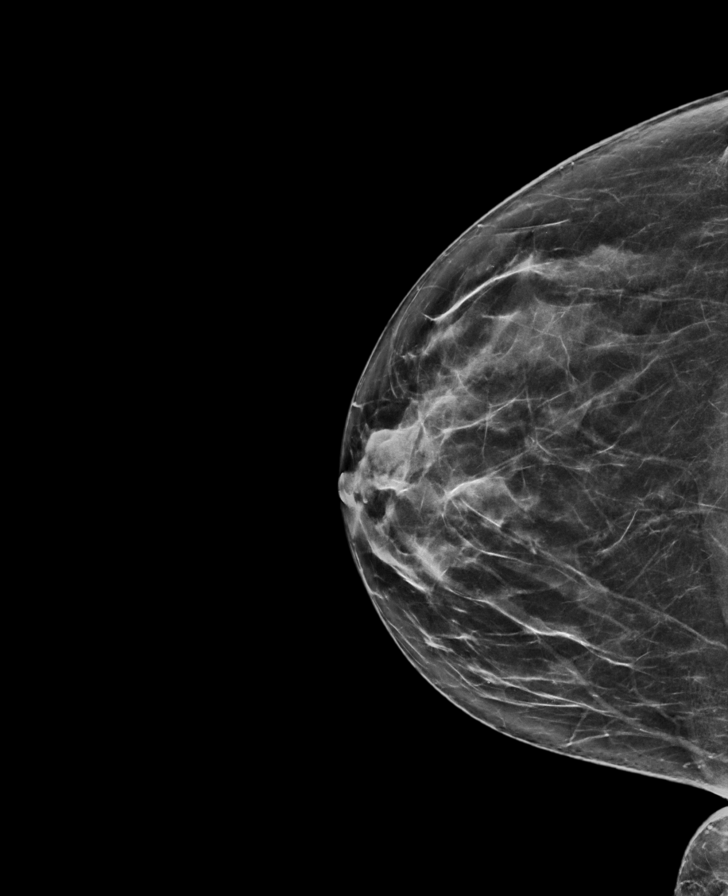

[R MLO synth-2D]
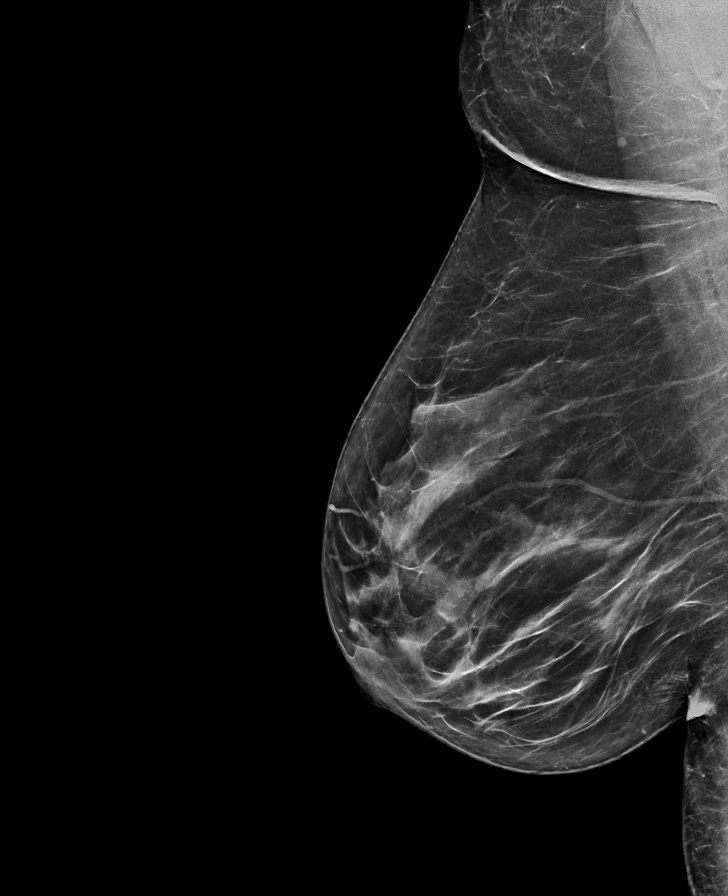

[L CC synth-2D]
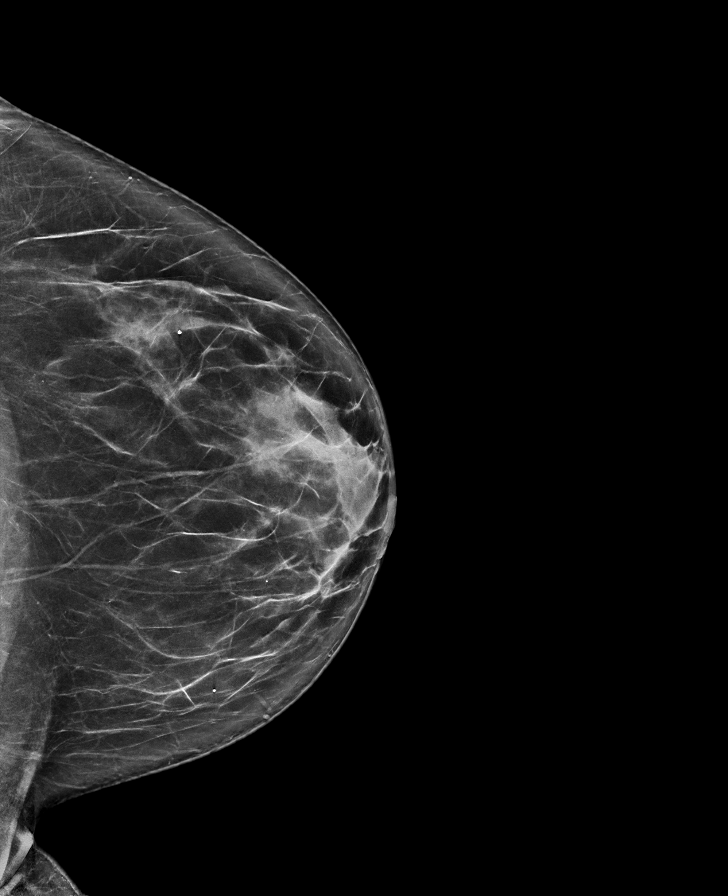

[L MLO synth-2D]
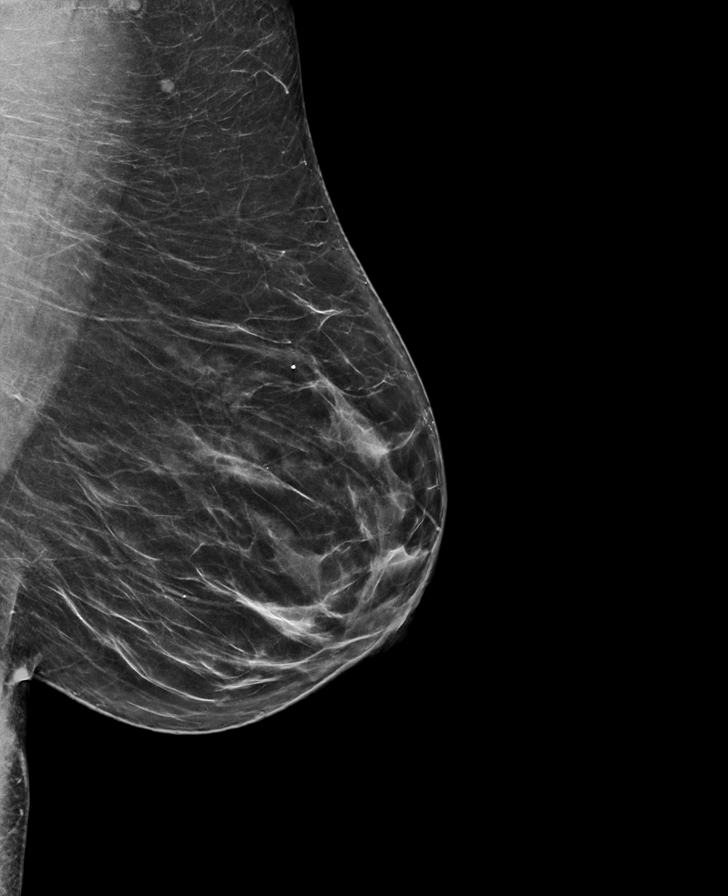

[R MLO tomo · tomo slice 37/72.0]
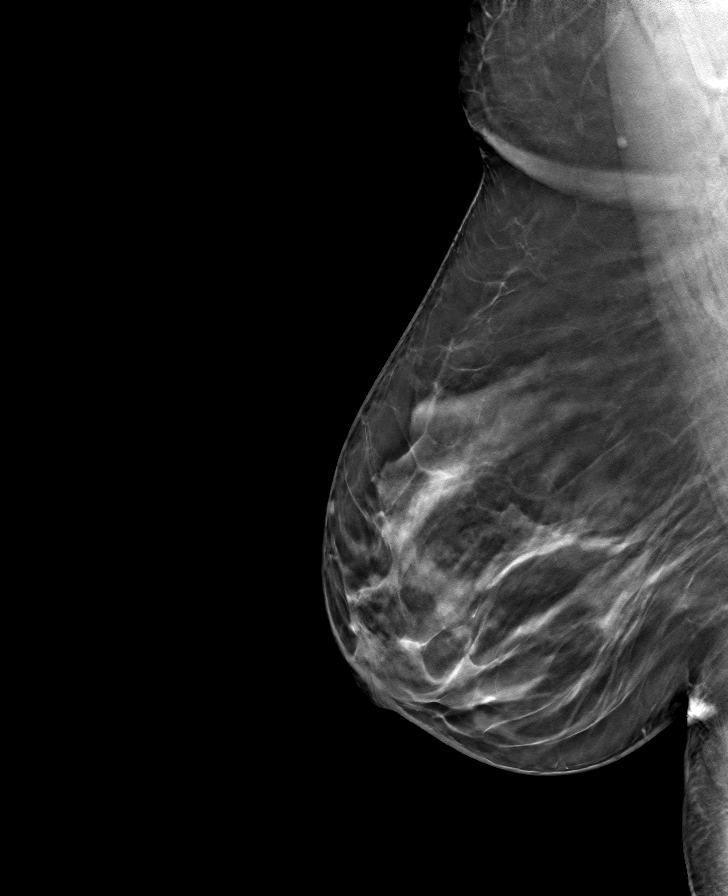

[L CC tomo · tomo slice 37/74.0]
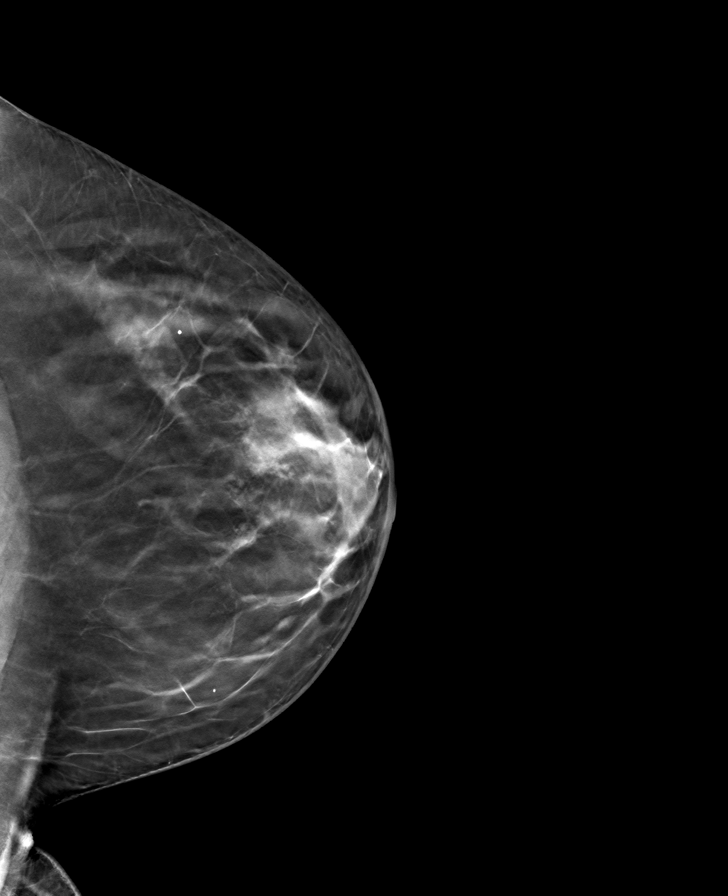

[L MLO tomo · tomo slice 35/69.0]
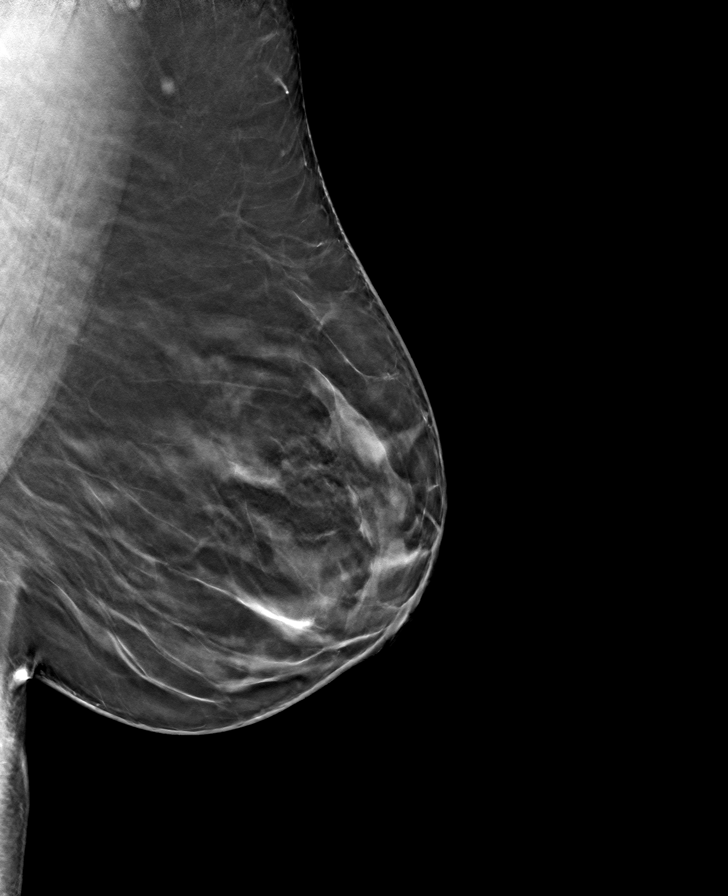

[R CC tomo · tomo slice 33/65.0]
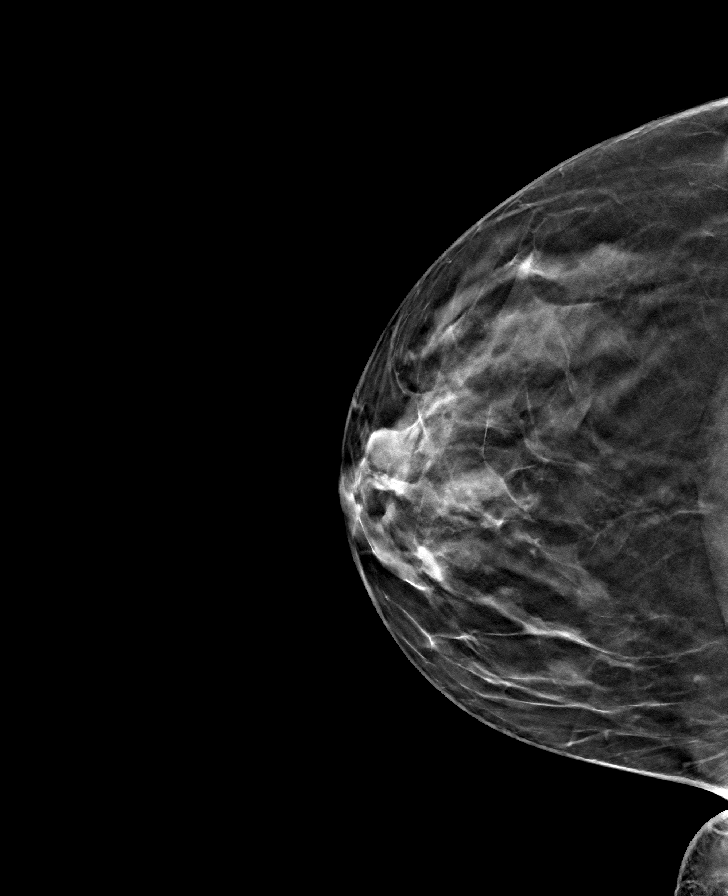

[8 of 24 positions shown; findings below may reference images not displayed]

ACR Breast Density Category c: The breast tissue is heterogeneously
dense, which may obscure small masses
FINDINGS: There are no findings suspicious for malignancy.
IMPRESSION: No mammographic evidence of malignancy. A result letter of this
screening mammogram will be mailed directly to the patient.

RECOMMENDATION:
Screening mammogram in one year. (Code:C8-T-HNK)

BI-RADS CATEGORY  1: Negative.

## 2023-01-05 ENCOUNTER — Emergency Department (HOSPITAL_COMMUNITY): Payer: Self-pay

## 2023-01-05 ENCOUNTER — Other Ambulatory Visit: Payer: Self-pay

## 2023-01-05 ENCOUNTER — Emergency Department (HOSPITAL_COMMUNITY)
Admission: EM | Admit: 2023-01-05 | Discharge: 2023-01-05 | Disposition: A | Discharge status: Home / Self Care | Payer: Self-pay | Attending: Emergency Medicine | Admitting: Emergency Medicine

## 2023-01-05 ENCOUNTER — Encounter (HOSPITAL_COMMUNITY): Payer: Self-pay

## 2023-01-05 DIAGNOSIS — N938 Other specified abnormal uterine and vaginal bleeding: Secondary | ICD-10-CM | POA: Insufficient documentation

## 2023-01-05 DIAGNOSIS — N939 Abnormal uterine and vaginal bleeding, unspecified: Secondary | ICD-10-CM

## 2023-01-05 LAB — URINALYSIS, ROUTINE W REFLEX MICROSCOPIC
Bilirubin Urine: NEGATIVE
Glucose, UA: NEGATIVE mg/dL
Ketones, ur: NEGATIVE mg/dL
Leukocytes,Ua: NEGATIVE
Nitrite: NEGATIVE
Protein, ur: NEGATIVE mg/dL
Specific Gravity, Urine: 1.009 (ref 1.005–1.030)
pH: 6 (ref 5.0–8.0)

## 2023-01-05 LAB — CBC
HCT: 29.2 % — ABNORMAL LOW (ref 36.0–46.0)
Hemoglobin: 8.4 g/dL — ABNORMAL LOW (ref 12.0–15.0)
MCH: 24.8 pg — ABNORMAL LOW (ref 26.0–34.0)
MCHC: 28.8 g/dL — ABNORMAL LOW (ref 30.0–36.0)
MCV: 86.1 fL (ref 80.0–100.0)
Platelets: 404 10*3/uL — ABNORMAL HIGH (ref 150–400)
RBC: 3.39 MIL/uL — ABNORMAL LOW (ref 3.87–5.11)
RDW: 28.2 % — ABNORMAL HIGH (ref 11.5–15.5)
WBC: 6.5 10*3/uL (ref 4.0–10.5)
nRBC: 0 % (ref 0.0–0.2)

## 2023-01-05 LAB — WET PREP, GENITAL
Clue Cells Wet Prep HPF POC: NONE SEEN
Sperm: NONE SEEN
Trich, Wet Prep: NONE SEEN
WBC, Wet Prep HPF POC: <10 (ref <10)
Yeast Wet Prep HPF POC: NONE SEEN

## 2023-01-05 LAB — BASIC METABOLIC PANEL
Anion gap: 5 (ref 5–15)
BUN: 13 mg/dL (ref 6–20)
CO2: 25 mmol/L (ref 22–32)
Calcium: 9.3 mg/dL (ref 8.9–10.3)
Chloride: 108 mmol/L (ref 98–111)
Creatinine, Ser: 0.6 mg/dL (ref 0.44–1.00)
GFR, Estimated: >60 mL/min (ref >60)
Glucose, Bld: 87 mg/dL (ref 70–99)
Potassium: 3.5 mmol/L (ref 3.5–5.1)
Sodium: 138 mmol/L (ref 135–145)

## 2023-01-05 LAB — TYPE AND SCREEN
ABO/RH(D): O POS
Antibody Screen: NEGATIVE

## 2023-01-05 MED ORDER — NORETHINDRONE ACETATE 5 MG PO TABS: 10.0000 mg | ORAL_TABLET | Freq: Every day | ORAL | 0 refills | Status: DC

## 2023-01-05 NOTE — Discharge Instructions (Addendum)
Please take your medications as prescribed. Take tylenol/ibuprofen for pain. I recommend close follow-up with OB-GYN for reevaluation.  Please do not hesitate to return to emergency department if worrisome signs symptoms we discussed become apparent.  Tome sus medicamentos segn lo recetado. Tome tylenol/ibuprofeno para Conservation officer, historic buildings. Recomiendo un seguimiento estrecho con un obstetra y gineclogo para una reevaluacin. No dude en regresar al departamento de emergencias si los sntomas preocupantes que discutimos se vuelven evidentes.

## 2023-01-05 NOTE — ED Triage Notes (Signed)
Pt arrived via POV. Pt has been having heavy vaginal bleeding for several days. Pt has hx of blood transfusions for similar episodes in the past. Pt c/o dizziness  AOX4

## 2023-01-05 NOTE — Progress Notes (Signed)
Patient scheduled to see GYN

## 2023-01-05 NOTE — ED Provider Notes (Signed)
[facility] Provider Note   CSN: [id number] Arrival date & time: 01/05/23  1249     History  Chief Complaint  Patient presents with   Vaginal Bleeding    Autumn Duncan is a 52 y.o. female with a past medical history of anemia presents today for evaluation of vaginal bleeding.  Patient reports she has had heavy menstrual period in the last several days.  States this has been an ongoing issue in the last couple years.  She reports mild abdominal cramping and dizziness.  She denies any fever, nausea, vomiting, chest pain, shortness of breath, bowel changes, urinary symptoms.   Vaginal Bleeding     Past Medical History:  Diagnosis Date   Anemia    Past Surgical History:  Procedure Laterality Date   CESAREAN SECTION     CHOLECYSTECTOMY     TUBAL LIGATION       Home Medications Prior to Admission medications   Medication Sig Start Date End Date Taking? Authorizing Provider  norethindrone (AYGESTIN) 5 MG tablet Take 2 tablets (10 mg total) by mouth daily. 01/05/23 02/04/23 Yes Rex Kras, PA  docusate sodium (COLACE) 100 MG capsule Take 1 capsule (100 mg total) by mouth daily as needed. 12/14/22 12/14/23  Edwin Dada, MD  ferrous sulfate 325 (65 FE) MG EC tablet Take 1 tablet (325 mg total) by mouth 2 (two) times daily. 12/14/22 12/14/23  Edwin Dada, MD  ibuprofen (ADVIL) 600 MG tablet Take 1 tablet (600 mg total) by mouth every 8 (eight) hours as needed for mild pain or moderate pain. 02/20/21   Camillia Herter, NP      Allergies    Patient has no known allergies.    Review of Systems   Review of Systems  Genitourinary:  Positive for vaginal bleeding.    Physical Exam Updated Vital Signs BP 125/75   Pulse 68   Temp 98.6 F (37 C) (Oral)   Resp 13   Ht 5\' 6"  (1.676 m)   Wt 78.4 kg   LMP  (LMP Unknown)   SpO2 98%   BMI 27.89 kg/m  Physical Exam Vitals and nursing note reviewed.   Constitutional:      Appearance: Normal appearance.  HENT:     Head: Normocephalic and atraumatic.     Mouth/Throat:     Mouth: Mucous membranes are moist.  Eyes:     General: No scleral icterus. Cardiovascular:     Rate and Rhythm: Normal rate and regular rhythm.     Pulses: Normal pulses.     Heart sounds: Normal heart sounds.  Pulmonary:     Effort: Pulmonary effort is normal.     Breath sounds: Normal breath sounds.  Abdominal:     General: Abdomen is flat.     Palpations: Abdomen is soft.     Tenderness: There is no abdominal tenderness.  Genitourinary:    Comments: Pelvic Exam with Female Chaperone present throughout the exam: External: Normal Vaginal: Normal with minimal amount of bleeding.  Bimanual: Negative  Musculoskeletal:        General: No deformity.  Skin:    General: Skin is warm.     Findings: No rash.  Neurological:     General: No focal deficit present.     Mental Status: She is alert.  Psychiatric:        Mood and Affect: Mood normal.     ED Results / Procedures / Treatments  Labs (all labs ordered are listed, but only abnormal results are displayed) Labs Reviewed  CBC - Abnormal; Notable for the following components:      Result Value   RBC 3.39 (*)    Hemoglobin 8.4 (*)    HCT 29.2 (*)    MCH 24.8 (*)    MCHC 28.8 (*)    RDW 28.2 (*)    Platelets 404 (*)    All other components within normal limits  URINALYSIS, ROUTINE W REFLEX MICROSCOPIC - Abnormal; Notable for the following components:   Color, Urine STRAW (*)    Hgb urine dipstick LARGE (*)    Bacteria, UA RARE (*)    All other components within normal limits  WET PREP, GENITAL  BASIC METABOLIC PANEL  HCG, SERUM, QUALITATIVE  TYPE AND SCREEN  ABO/RH  GC/CHLAMYDIA PROBE AMP ([place]) NOT AT [facility]    EKG None  Radiology US Pelvis Complete  Result Date: 01/05/2023 CLINICAL DATA:  Vaginal bleeding. EXAM: TRANSABDOMINAL AND TRANSVAGINAL ULTRASOUND OF PELVIS TECHNIQUE:  Both transabdominal and transvaginal ultrasound examinations of the pelvis were performed. Transabdominal technique was performed for global imaging of the pelvis including uterus, ovaries, adnexal regions, and pelvic cul-de-sac. It was necessary to proceed with endovaginal exam following the transabdominal exam to visualize the endometrium and ovaries. COMPARISON:  None Available. FINDINGS: Uterus Measurements: 12.3 x 7.3 x 10.3 cm = volume: 481 mL. Fibroid over the anterior left side of the uterine fundus measuring 5.5 x 5.0 x 5.1 cm with smaller fibroid over the anterior left side of the uterine body measuring 3.3 x 3.0 x 3.5 cm. Uterus is heterogeneous. Endometrium Very difficult to visualize as an accurate measurement cannot be obtained. Hypoechoic appearance to the endometrium seen only on the transabdominal images which may represent mild complicated fluid. This is not seen on the endovaginal images. Right ovary Measurements: 4.3 x 2.1 x 2.1 cm = volume: 9.7 mL. Normal appearance/no adnexal mass. Normal color Doppler. Left ovary Measurements: 4.4 x 1.6 x 3.6 cm = volume: 13.6 mL. Normal appearance/no adnexal mass. Normal color Doppler. Other findings No free fluid. IMPRESSION: 1. Enlarged heterogeneous uterus with 2 fibroids as the largest measures 5.5 cm over the left side of the uterine fundus. 2. Endometrium is very difficult to visualize as an accurate measurement cannot be obtained. Hypoechoic appearance to the endometrium seen only on the transabdominal images which may represent moderate complicated fluid. Recommend gyn consultation for further evaluation as sonohysterography may be helpful to better evaluate the endometrium. Electronically Signed   By: Marin Olp M.D.   On: 01/05/2023 16:47   US Transvaginal Non-OB  Result Date: 01/05/2023 CLINICAL DATA:  Vaginal bleeding. EXAM: TRANSABDOMINAL AND TRANSVAGINAL ULTRASOUND OF PELVIS TECHNIQUE: Both transabdominal and transvaginal ultrasound  examinations of the pelvis were performed. Transabdominal technique was performed for global imaging of the pelvis including uterus, ovaries, adnexal regions, and pelvic cul-de-sac. It was necessary to proceed with endovaginal exam following the transabdominal exam to visualize the endometrium and ovaries. COMPARISON:  None Available. FINDINGS: Uterus Measurements: 12.3 x 7.3 x 10.3 cm = volume: 481 mL. Fibroid over the anterior left side of the uterine fundus measuring 5.5 x 5.0 x 5.1 cm with smaller fibroid over the anterior left side of the uterine body measuring 3.3 x 3.0 x 3.5 cm. Uterus is heterogeneous. Endometrium Very difficult to visualize as an accurate measurement cannot be obtained. Hypoechoic appearance to the endometrium seen only on the transabdominal images which may represent mild complicated  fluid. This is not seen on the endovaginal images. Right ovary Measurements: 4.3 x 2.1 x 2.1 cm = volume: 9.7 mL. Normal appearance/no adnexal mass. Normal color Doppler. Left ovary Measurements: 4.4 x 1.6 x 3.6 cm = volume: 13.6 mL. Normal appearance/no adnexal mass. Normal color Doppler. Other findings No free fluid. IMPRESSION: 1. Enlarged heterogeneous uterus with 2 fibroids as the largest measures 5.5 cm over the left side of the uterine fundus. 2. Endometrium is very difficult to visualize as an accurate measurement cannot be obtained. Hypoechoic appearance to the endometrium seen only on the transabdominal images which may represent moderate complicated fluid. Recommend gyn consultation for further evaluation as sonohysterography may be helpful to better evaluate the endometrium. Electronically Signed   By: Marin Olp M.D.   On: 01/05/2023 16:47    Procedures Procedures    Medications Ordered in ED Medications - No data to display  ED Course/ Medical Decision Making/ A&P                             Medical Decision Making Amount and/or Complexity of Data Reviewed Labs:  ordered. Radiology: ordered.   This patient presents to the ED for abnormal vaginal bleeding, this involves an extensive number of treatment options, and is a complaint that carries with a high risk of complications and morbidity.  The differential diagnosis includes fibroids, ectopic pregnancy, molar pregnancy, coagulopathy, trauma.  This is not an exhaustive list.  Lab tests: I ordered and personally interpreted labs.  The pertinent results include: WBC unremarkable. Hbg 8.4. Platelets unremarkable. Electrolytes unremarkable. BUN, creatinine unremarkable.  UA unremarkable  Imaging studies: I ordered imaging studies. I personally reviewed, interpreted imaging and agree with the radiologist's interpretations. The results include: 1. Enlarged heterogeneous uterus with 2 fibroids as the largest measures 5.5 cm over the left side of the uterine fundus. 2. Endometrium is very difficult to visualize as an accurate measurement cannot be obtained. Hypoechoic appearance to the endometrium seen only on the transabdominal images which may represent moderate complicated fluid. Recommend gyn consultation for further evaluation as sonohysterography may be helpful to better evaluate the endometrium.   Problem list/ ED course/ Critical interventions/ Medical management: HPI: See above Vital signs within normal range and stable throughout visit. Laboratory/imaging studies significant for: See above. On physical examination, patient is afebrile and appears in no acute distress. Patient presents with vaginal bleeding likely secondary to fibroids or other non-emergent cause of abnormal uterine bleeding such as anovulatory cycle. Based on History, Exam, and ED Workup patient's presentation not consistent with ectopic pregnancy, molar pregnancy, life-threatening coagulopathy, trauma, serious bacterial infection. Patient given Aygestin and will follow up with OBGYN. I have reviewed the patient home medicines and have  made adjustments as needed.  Cardiac monitoring/EKG: The patient was maintained on a cardiac monitor.  I personally reviewed and interpreted the cardiac monitor which showed an underlying rhythm of: sinus rhythm.  Additional history obtained: External records from outside source obtained and reviewed including: Chart review including previous notes, labs, imaging.  Consultations obtained: I requested consultation with Dr. Mikel Cella, and discussed lab and imaging findings as well as pertinent plan.  She recommended norethindrone prescription and outpatient follow-up with OB/GYN.  Disposition Continued outpatient therapy. Follow-up with OB/GYN recommended for reevaluation of symptoms. Treatment plan discussed with patient.  Pt acknowledged understanding was agreeable to the plan. Worrisome signs and symptoms were discussed with patient, and patient acknowledged understanding to return to  the ED if they noticed these signs and symptoms. Patient was stable upon discharge.   This chart was dictated using voice recognition software.  Despite best efforts to proofread,  errors can occur which can change the documentation meaning.          Final Clinical Impression(s) / ED Diagnoses Final diagnoses:  Vaginal bleeding    Rx / DC Orders ED Discharge Orders          Ordered    norethindrone (AYGESTIN) 5 MG tablet  Daily        01/05/23 1713              Rex Kras, PA 01/05/23 1734    Lajean Saver, MD 01/05/23 Marjo Bicker    Milton Ferguson, MD 01/10/23 [phone]

## 2023-01-06 LAB — GC/CHLAMYDIA PROBE AMP (~~LOC~~) NOT AT ARMC
Chlamydia: NEGATIVE
Comment: NEGATIVE
Comment: NORMAL
Neisseria Gonorrhea: NEGATIVE

## 2023-01-09 ENCOUNTER — Encounter: Payer: Self-pay | Admitting: Obstetrics and Gynecology

## 2023-01-09 ENCOUNTER — Other Ambulatory Visit: Payer: Self-pay

## 2023-01-09 ENCOUNTER — Ambulatory Visit (INDEPENDENT_AMBULATORY_CARE_PROVIDER_SITE_OTHER): Payer: Self-pay | Admitting: Obstetrics and Gynecology

## 2023-01-09 VITALS — BP 143/61 | HR 81 | Wt 173.2 lb

## 2023-01-09 DIAGNOSIS — N920 Excessive and frequent menstruation with regular cycle: Secondary | ICD-10-CM

## 2023-01-09 MED ORDER — NORETHINDRONE ACETATE 5 MG PO TABS: 10.0000 mg | ORAL_TABLET | Freq: Every day | ORAL | 3 refills | Status: DC

## 2023-01-09 NOTE — Progress Notes (Signed)
[facility] PAT[facility] Patient name: Autumn Duncan MRN [id number]  Date of birth: 1971/03/01 Chief Complaint:   Gynecologic Exam     History:  Autumn Duncan is a 52 y.o. No obstetric history on file. being seen today for heavy menstrual bleeding for 4 years. Gaver her medication on Sunday to help with bleeding and supposed to take for a month. Pain in her LLQ and passing clots with her menses. Bleeding a little today, stopped yesterday.  Nearly 4 years ago  Did the transfusion, referred to a gyn, they checked her never called her back or got any information  Doctor on Sunday didn't understand why she hadn't been referred to anyone and concenrd it was due to the lack of insurance  Ultimately she would like to have hysterectomy and just be done with bleeding altogether      Gynecologic History Patient's last menstrual period was 12/22/2022. Contraception: tubal ligation Last Pap: No results found for: "DIAGPAP", "Deweese", "ADEQPAP" Last Mammogram: 2022 BIRADS 1 Last Colonoscopy: N/A  Obstetric History OB History  No obstetric history on file.    Past Medical History:  Diagnosis Date   Anemia     Past Surgical History:  Procedure Laterality Date   CESAREAN SECTION     CHOLECYSTECTOMY     TUBAL LIGATION      Current Outpatient Medications on File Prior to Visit  Medication Sig Dispense Refill   docusate sodium (COLACE) 100 MG capsule Take 1 capsule (100 mg total) by mouth daily as needed. 30 capsule 2   ferrous sulfate 325 (65 FE) MG EC tablet Take 1 tablet (325 mg total) by mouth 2 (two) times daily. 60 tablet 3   ibuprofen (ADVIL) 600 MG tablet Take 1 tablet (600 mg total) by mouth every 8 (eight) hours as needed for mild pain or moderate pain. 30 tablet 0   norethindrone (AYGESTIN) 5 MG tablet Take 2 tablets (10 mg total) by mouth daily. 60 tablet 0   No current facility-administered medications on file prior to visit.    No Known  Allergies  Social History:  reports that she has never smoked. She has never used smokeless tobacco. She reports that she does not currently use alcohol after a past usage of about 1.0 - 2.0 standard drink of alcohol per week. She reports that she does not use drugs.  Family History  Problem Relation Age of Onset   Thyroid disease Mother     The following portions of the patient's history were reviewed and updated as appropriate: allergies, current medications, past family history, past medical history, past social history, past surgical history and problem list.  Review of Systems Pertinent items noted in HPI and remainder of comprehensive ROS otherwise negative.  Physical Exam:  BP (!) 141/91   Pulse 81   Wt 173 lb 3.2 oz (78.6 kg)   LMP 12/22/2022   BMI 27.96 kg/m  Physical Exam Vitals and nursing note reviewed.  Constitutional:      Appearance: Normal appearance.  Cardiovascular:     Rate and Rhythm: Normal rate.  Pulmonary:     Effort: Pulmonary effort is normal.     Breath sounds: Normal breath sounds.  Neurological:     General: No focal deficit present.     Mental Status: She is alert and oriented to person, place, and time.  Psychiatric:        Mood and Affect: Mood normal.        Behavior: Behavior normal.  Thought Content: Thought content normal.        Judgment: Judgment normal.    US Transvaginal Non-OB CLINICAL DATA:  Vaginal bleeding.  EXAM: TRANSABDOMINAL AND TRANSVAGINAL ULTRASOUND OF PELVIS  TECHNIQUE: Both transabdominal and transvaginal ultrasound examinations of the pelvis were performed. Transabdominal technique was performed for global imaging of the pelvis including uterus, ovaries, adnexal regions, and pelvic cul-de-sac. It was necessary to proceed with endovaginal exam following the transabdominal exam to visualize the endometrium and ovaries.  COMPARISON:  None Available.  FINDINGS: Uterus  Measurements: 12.3 x 7.3 x 10.3 cm  = volume: 481 mL. Fibroid over the anterior left side of the uterine fundus measuring 5.5 x 5.0 x 5.1 cm with smaller fibroid over the anterior left side of the uterine body measuring 3.3 x 3.0 x 3.5 cm. Uterus is heterogeneous.  Endometrium  Very difficult to visualize as an accurate measurement cannot be obtained. Hypoechoic appearance to the endometrium seen only on the transabdominal images which may represent mild complicated fluid. This is not seen on the endovaginal images.  Right ovary  Measurements: 4.3 x 2.1 x 2.1 cm = volume: 9.7 mL. Normal appearance/no adnexal mass. Normal color Doppler.  Left ovary  Measurements: 4.4 x 1.6 x 3.6 cm = volume: 13.6 mL. Normal appearance/no adnexal mass. Normal color Doppler.  Other findings  No free fluid.  IMPRESSION: 1. Enlarged heterogeneous uterus with 2 fibroids as the largest measures 5.5 cm over the left side of the uterine fundus.  2. Endometrium is very difficult to visualize as an accurate measurement cannot be obtained. Hypoechoic appearance to the endometrium seen only on the transabdominal images which may represent moderate complicated fluid. Recommend gyn consultation for further evaluation as sonohysterography may be helpful to better evaluate the endometrium.  Electronically Signed   By: Marin Olp M.D.   On: 01/05/2023 16:47 US Pelvis Complete CLINICAL DATA:  Vaginal bleeding.  EXAM: TRANSABDOMINAL AND TRANSVAGINAL ULTRASOUND OF PELVIS  TECHNIQUE: Both transabdominal and transvaginal ultrasound examinations of the pelvis were performed. Transabdominal technique was performed for global imaging of the pelvis including uterus, ovaries, adnexal regions, and pelvic cul-de-sac. It was necessary to proceed with endovaginal exam following the transabdominal exam to visualize the endometrium and ovaries.  COMPARISON:  None Available.  FINDINGS: Uterus  Measurements: 12.3 x 7.3 x 10.3 cm = volume: 481  mL. Fibroid over the anterior left side of the uterine fundus measuring 5.5 x 5.0 x 5.1 cm with smaller fibroid over the anterior left side of the uterine body measuring 3.3 x 3.0 x 3.5 cm. Uterus is heterogeneous.  Endometrium  Very difficult to visualize as an accurate measurement cannot be obtained. Hypoechoic appearance to the endometrium seen only on the transabdominal images which may represent mild complicated fluid. This is not seen on the endovaginal images.  Right ovary  Measurements: 4.3 x 2.1 x 2.1 cm = volume: 9.7 mL. Normal appearance/no adnexal mass. Normal color Doppler.  Left ovary  Measurements: 4.4 x 1.6 x 3.6 cm = volume: 13.6 mL. Normal appearance/no adnexal mass. Normal color Doppler.  Other findings  No free fluid.  IMPRESSION: 1. Enlarged heterogeneous uterus with 2 fibroids as the largest measures 5.5 cm over the left side of the uterine fundus.  2. Endometrium is very difficult to visualize as an accurate measurement cannot be obtained. Hypoechoic appearance to the endometrium seen only on the transabdominal images which may represent moderate complicated fluid. Recommend gyn consultation for further evaluation as sonohysterography may be helpful  to better evaluate the endometrium.  Electronically Signed   By: Marin Olp M.D.   On: 01/05/2023 16:47     Assessment and Plan:  1. Menorrhagia with regular cycle Discussed management options for abnormal uterine bleeding including NSAIDs (Naproxen), tranexamic acid (Lysteda), oral progesterone, Depo Provera, Levonogestrel IUD, endometrial ablation or hysterectomy as definitive surgical management.  Discussed risks and benefits of each method.   Continue aygestin for now. Labs for further evaluation of bleeding. US demonstrates multifibroids. Patient ultimately interested in hysterectomy. Recommend pap and EMB at follow up visit and further discuss hysterectomy. Also encouraged completion of  financial application.   - norethindrone (AYGESTIN) 5 MG tablet; Take 2 tablets (10 mg total) by mouth daily.  Dispense: 60 tablet; Refill: 3 - TSH Rfx on Abnormal to Free T4 - HgB A1c   Routine preventative health maintenance measures emphasized. Please refer to After Visit Summary for other counseling recommendations.   Follow-up: Return for GYN Follow Up.      Darliss Cheney, MD Obstetrician & Gynecologist, [facility] [employer] [facility], [place]

## 2023-01-10 LAB — HEMOGLOBIN A1C
Est. average glucose Bld gHb Est-mCnc: 80 mg/dL
Hgb A1c MFr Bld: 4.4 % — ABNORMAL LOW (ref 4.8–5.6)

## 2023-01-10 LAB — TSH RFX ON ABNORMAL TO FREE T4: TSH: 1.33 u[IU]/mL (ref 0.450–4.500)

## 2023-01-13 ENCOUNTER — Telehealth: Payer: Self-pay

## 2023-01-13 NOTE — Telephone Encounter (Signed)
Called Pt using [facility] id# [phone], to go over Pts negative test results for her A1c & Thyroid, no answer, left VM.

## 2023-01-13 NOTE — Telephone Encounter (Signed)
-----   Message from Darliss Cheney, MD sent at 01/10/2023  1:56 PM EDT ----- Notify that A1c and thyroid function normal

## 2023-01-20 ENCOUNTER — Telehealth: Payer: Self-pay | Admitting: Family Medicine

## 2023-01-20 NOTE — Telephone Encounter (Signed)
Patient states her cycle comes and go....she goes to the restroom and when she uses it "black stuff comes out, like clots of dark of blood". Left side of her vaginal area hurts. The middle of her breast hurts too and to the left, makes it hard to catch her breath

## 2023-01-21 NOTE — Telephone Encounter (Signed)
Called pt with [employer] and pt reports that her bleeding has been ongoing.  Pt reports that she has not started taking the Aygestin as she received an injection in the hospital.  I explained to the pt that taking the medication will help with her bleeding and then she will be able to follow up with Currie Paris, MD on 02/12/23 to see if it is effective.  Pt verbalized understanding with no further questions.   Frances Nickels 01/21/23

## 2023-02-12 ENCOUNTER — Other Ambulatory Visit: Payer: Self-pay

## 2023-02-12 ENCOUNTER — Ambulatory Visit (INDEPENDENT_AMBULATORY_CARE_PROVIDER_SITE_OTHER): Payer: Self-pay | Admitting: Obstetrics and Gynecology

## 2023-02-12 ENCOUNTER — Other Ambulatory Visit (HOSPITAL_COMMUNITY)
Admission: RE | Admit: 2023-02-12 | Discharge: 2023-02-12 | Disposition: A | Discharge status: Home / Self Care | Payer: Self-pay | Source: Ambulatory Visit | Attending: Obstetrics and Gynecology | Admitting: Obstetrics and Gynecology

## 2023-02-12 VITALS — BP 174/80 | HR 73 | Wt 186.3 lb

## 2023-02-12 DIAGNOSIS — N920 Excessive and frequent menstruation with regular cycle: Secondary | ICD-10-CM

## 2023-02-12 DIAGNOSIS — Z124 Encounter for screening for malignant neoplasm of cervix: Secondary | ICD-10-CM

## 2023-02-12 NOTE — H&P (View-Only) (Signed)
  GYNECOLOGY VISIT  Patient name: Autumn Duncan MRN [id number]  Date of birth: 08/27/1971 Chief Complaint:   Follow-up and ABNORMAL UTERINE BLEEDING   History:  Autumn Duncan is a 52 y.o. No obstetric history on file. being seen today for AUB follow up.   RAN out of pills and then picked up refill, started on April 1 and then had bleeding started on April 5; the first few days were heavy and they started bleeding less and less until last tuesday Just started pills at hte sop of htie month Had stopped bleeding on 4/5 and then stopped on Tuesday Still having LLQ pain that comes and goes    Past Medical History:  Diagnosis Date  . Anemia     Past Surgical History:  Procedure Laterality Date  . CESAREAN SECTION    . CHOLECYSTECTOMY    . TUBAL LIGATION      The following portions of the patient's history were reviewed and updated as appropriate: allergies, current medications, past family history, past medical history, past social history, past surgical history and problem list.   Health Maintenance:   Last pap none on file Last mammogram: 07/2021 [facility]1   Review of Systems:  Pertinent items are noted in HPI. Comprehensive review of systems was otherwise negative.   Objective:  Physical Exam BP (!) 174/80   Pulse 73   Wt 186 lb 4.8 oz (84.5 kg)   LMP 01/24/2023   BMI 30.07 kg/m    Physical Exam Vitals and nursing note reviewed. Exam conducted with a chaperone present.  Constitutional:      Appearance: Normal appearance.  HENT:     Head: Normocephalic and atraumatic.  Pulmonary:     Effort: Pulmonary effort is normal.     Breath sounds: Normal breath sounds.  Genitourinary:    General: Normal vulva.     Exam position: Lithotomy position.     Vagina: Normal.     Cervix: Normal.  Skin:    General: Skin is warm and dry.  Neurological:     General: No focal deficit present.     Mental Status: She is alert.  Psychiatric:        Mood and  Affect: Mood normal.        Behavior: Behavior normal.        Thought Content: Thought content normal.        Judgment: Judgment normal.     Labs and Imaging US Transvaginal Non-OB CLINICAL DATA:  Vaginal bleeding.  EXAM: TRANSABDOMINAL AND TRANSVAGINAL ULTRASOUND OF PELVIS  TECHNIQUE: Both transabdominal and transvaginal ultrasound examinations of the pelvis were performed. Transabdominal technique was performed for global imaging of the pelvis including uterus, ovaries, adnexal regions, and pelvic cul-de-sac. It was necessary to proceed with endovaginal exam following the transabdominal exam to visualize the endometrium and ovaries.  COMPARISON:  None Available.  FINDINGS: Uterus  Measurements: 12.3 x 7.3 x 10.3 cm = volume: 481 mL. Fibroid over the anterior left side of the uterine fundus measuring 5.5 x 5.0 x 5.1 cm with smaller fibroid over the anterior left side of the uterine body measuring 3.3 x 3.0 x 3.5 cm. Uterus is heterogeneous.  Endometrium  Very difficult to visualize as an accurate measurement cannot be obtained. Hypoechoic appearance to the endometrium seen only on the transabdominal images which may represent mild complicated fluid. This is not seen on the endovaginal images.  Right ovary  Measurements: 4.3 x 2.1 x 2.1 cm = volume: 9.7   mL. Normal appearance/no adnexal mass. Normal color Doppler.  Left ovary  Measurements: 4.4 x 1.6 x 3.6 cm = volume: 13.6 mL. Normal appearance/no adnexal mass. Normal color Doppler.  Other findings  No free fluid.  IMPRESSION: 1. Enlarged heterogeneous uterus with 2 fibroids as the largest measures 5.5 cm over the left side of the uterine fundus.  2. Endometrium is very difficult to visualize as an accurate measurement cannot be obtained. Hypoechoic appearance to the endometrium seen only on the transabdominal images which may represent moderate complicated fluid. Recommend gyn consultation for further  evaluation as sonohysterography may be helpful to better evaluate the endometrium.  Electronically Signed   By: Daniel  Boyle M.D.   On: 01/05/2023 16:47 US Pelvis Complete CLINICAL DATA:  Vaginal bleeding.  EXAM: TRANSABDOMINAL AND TRANSVAGINAL ULTRASOUND OF PELVIS  TECHNIQUE: Both transabdominal and transvaginal ultrasound examinations of the pelvis were performed. Transabdominal technique was performed for global imaging of the pelvis including uterus, ovaries, adnexal regions, and pelvic cul-de-sac. It was necessary to proceed with endovaginal exam following the transabdominal exam to visualize the endometrium and ovaries.  COMPARISON:  None Available.  FINDINGS: Uterus  Measurements: 12.3 x 7.3 x 10.3 cm = volume: 481 mL. Fibroid over the anterior left side of the uterine fundus measuring 5.5 x 5.0 x 5.1 cm with smaller fibroid over the anterior left side of the uterine body measuring 3.3 x 3.0 x 3.5 cm. Uterus is heterogeneous.  Endometrium  Very difficult to visualize as an accurate measurement cannot be obtained. Hypoechoic appearance to the endometrium seen only on the transabdominal images which may represent mild complicated fluid. This is not seen on the endovaginal images.  Right ovary  Measurements: 4.3 x 2.1 x 2.1 cm = volume: 9.7 mL. Normal appearance/no adnexal mass. Normal color Doppler.  Left ovary  Measurements: 4.4 x 1.6 x 3.6 cm = volume: 13.6 mL. Normal appearance/no adnexal mass. Normal color Doppler.  Other findings  No free fluid.  IMPRESSION: 1. Enlarged heterogeneous uterus with 2 fibroids as the largest measures 5.5 cm over the left side of the uterine fundus.  2. Endometrium is very difficult to visualize as an accurate measurement cannot be obtained. Hypoechoic appearance to the endometrium seen only on the transabdominal images which may represent moderate complicated fluid. Recommend gyn consultation for further evaluation as  sonohysterography may be helpful to better evaluate the endometrium.  Electronically Signed   By: Daniel  Boyle M.D.   On: 01/05/2023 16:47  Endometrial Biopsy Procedure  Patient identified, informed consent performed,  indication reviewed, consent signed.  Reviewed risk of perforation, pain, bleeding, insufficient sample, etc were reviewd. Time out was performed.  Urine pregnancy test negative.  Speculum placed in the vagina.  Cervix visualized.  Cleaned with Betadine x 2.  Anterior cervix grasped anteriorly with a single tooth tenaculum.  Paracervical block was not administered.  Endometrial pipelle was passed twice without difficulty and sample obtained. Tenaculum was removed, good hemostasis noted.  Patient tolerated procedure well.  Patient was given post-procedure instructions.      Assessment & Plan:   1. Menorrhagia with regular cycle Discussed medical and surgical options for AUB management. Patient would like to proceed with definitive management via hysterectomy. Now s/p uncomplicated EMB. CBC ordered for next month to assess status of anemia and if preop correction needed. Continue aygestin in the interim for control of menses until surgery completed. Has completed financial application as well.  - CBC; Future - Surgical pathology - Cytology -   PAP  2. Screening for cervical cancer Routine pap collected - - Cytology - PAP  Patient desires surgical management with RA-TLH, BS, cysto.  The risks of surgery were discussed in detail with the patient including but not limited to: bleeding which may require transfusion or reoperation; infection which may require prolonged hospitalization or re-hospitalization and antibiotic therapy; injury to bowel, bladder, ureters and major vessels or other surrounding organs which may lead to other procedures; formation of adhesions; need for additional procedures including laparotomy or subsequent procedures secondary to intraoperative injury or  abnormal pathology; thromboembolic phenomenon; incisional problems and other postoperative or anesthesia complications.  Patient was told that the likelihood that her condition and symptoms will be treated effectively with this surgical management was very high; the postoperative expectations were also discussed in detail. The patient also understands the alternative treatment options which were discussed in full. All questions were answered.  She was told that she will be contacted by our surgical scheduler regarding the time and date of her surgery; routine preoperative instructions will be given to her by the preoperative nursing team.  Printed patient education handouts about the procedure were given to the patient to review at home.   Sherice Ijames, MD Minimally Invasive Gy[facility]ologic [facility], [facility] 

## 2023-02-12 NOTE — Progress Notes (Addendum)
GYNECOLOGY VISIT  Patient name: Autumn Duncan MRN [id number]  Date of birth: 05-Oct-1971 Chief Complaint:   Follow-up and ABNORMAL UTERINE BLEEDING   History:  Autumn Duncan is a 52 y.o. No obstetric history on file. being seen today for AUB follow up.   RAN out of pills and then picked up refill, started on April 1 and then had bleeding started on April 5; the first few days were heavy and they started bleeding less and less until last tuesday Just started pills at hte sop of htie month Had stopped bleeding on 4/5 and then stopped on Tuesday Still having LLQ pain that comes and goes    Past Medical History:  Diagnosis Date  . Anemia     Past Surgical History:  Procedure Laterality Date  . CESAREAN SECTION    . CHOLECYSTECTOMY    . TUBAL LIGATION      The following portions of the patient's history were reviewed and updated as appropriate: allergies, current medications, past family history, past medical history, past social history, past surgical history and problem list.   Health Maintenance:   Last pap none on file Last mammogram: 07/2021 [facility]1   Review of Systems:  Pertinent items are noted in HPI. Comprehensive review of systems was otherwise negative.   Objective:  Physical Exam BP (!) 174/80   Pulse 73   Wt 186 lb 4.8 oz (84.5 kg)   LMP 01/24/2023   BMI 30.07 kg/m    Physical Exam Vitals and nursing note reviewed. Exam conducted with a chaperone present.  Constitutional:      Appearance: Normal appearance.  HENT:     Head: Normocephalic and atraumatic.  Pulmonary:     Effort: Pulmonary effort is normal.     Breath sounds: Normal breath sounds.  Genitourinary:    General: Normal vulva.     Exam position: Lithotomy position.     Vagina: Normal.     Cervix: Normal.  Skin:    General: Skin is warm and dry.  Neurological:     General: No focal deficit present.     Mental Status: She is alert.  Psychiatric:        Mood and  Affect: Mood normal.        Behavior: Behavior normal.        Thought Content: Thought content normal.        Judgment: Judgment normal.     Labs and Imaging US Transvaginal Non-OB CLINICAL DATA:  Vaginal bleeding.  EXAM: TRANSABDOMINAL AND TRANSVAGINAL ULTRASOUND OF PELVIS  TECHNIQUE: Both transabdominal and transvaginal ultrasound examinations of the pelvis were performed. Transabdominal technique was performed for global imaging of the pelvis including uterus, ovaries, adnexal regions, and pelvic cul-de-sac. It was necessary to proceed with endovaginal exam following the transabdominal exam to visualize the endometrium and ovaries.  COMPARISON:  None Available.  FINDINGS: Uterus  Measurements: 12.3 x 7.3 x 10.3 cm = volume: 481 mL. Fibroid over the anterior left side of the uterine fundus measuring 5.5 x 5.0 x 5.1 cm with smaller fibroid over the anterior left side of the uterine body measuring 3.3 x 3.0 x 3.5 cm. Uterus is heterogeneous.  Endometrium  Very difficult to visualize as an accurate measurement cannot be obtained. Hypoechoic appearance to the endometrium seen only on the transabdominal images which may represent mild complicated fluid. This is not seen on the endovaginal images.  Right ovary  Measurements: 4.3 x 2.1 x 2.1 cm = volume: 9.7  mL. Normal appearance/no adnexal mass. Normal color Doppler.  Left ovary  Measurements: 4.4 x 1.6 x 3.6 cm = volume: 13.6 mL. Normal appearance/no adnexal mass. Normal color Doppler.  Other findings  No free fluid.  IMPRESSION: 1. Enlarged heterogeneous uterus with 2 fibroids as the largest measures 5.5 cm over the left side of the uterine fundus.  2. Endometrium is very difficult to visualize as an accurate measurement cannot be obtained. Hypoechoic appearance to the endometrium seen only on the transabdominal images which may represent moderate complicated fluid. Recommend gyn consultation for further  evaluation as sonohysterography may be helpful to better evaluate the endometrium.  Electronically Signed   By: Elberta Fortis M.D.   On: 01/05/2023 16:47 US Pelvis Complete CLINICAL DATA:  Vaginal bleeding.  EXAM: TRANSABDOMINAL AND TRANSVAGINAL ULTRASOUND OF PELVIS  TECHNIQUE: Both transabdominal and transvaginal ultrasound examinations of the pelvis were performed. Transabdominal technique was performed for global imaging of the pelvis including uterus, ovaries, adnexal regions, and pelvic cul-de-sac. It was necessary to proceed with endovaginal exam following the transabdominal exam to visualize the endometrium and ovaries.  COMPARISON:  None Available.  FINDINGS: Uterus  Measurements: 12.3 x 7.3 x 10.3 cm = volume: 481 mL. Fibroid over the anterior left side of the uterine fundus measuring 5.5 x 5.0 x 5.1 cm with smaller fibroid over the anterior left side of the uterine body measuring 3.3 x 3.0 x 3.5 cm. Uterus is heterogeneous.  Endometrium  Very difficult to visualize as an accurate measurement cannot be obtained. Hypoechoic appearance to the endometrium seen only on the transabdominal images which may represent mild complicated fluid. This is not seen on the endovaginal images.  Right ovary  Measurements: 4.3 x 2.1 x 2.1 cm = volume: 9.7 mL. Normal appearance/no adnexal mass. Normal color Doppler.  Left ovary  Measurements: 4.4 x 1.6 x 3.6 cm = volume: 13.6 mL. Normal appearance/no adnexal mass. Normal color Doppler.  Other findings  No free fluid.  IMPRESSION: 1. Enlarged heterogeneous uterus with 2 fibroids as the largest measures 5.5 cm over the left side of the uterine fundus.  2. Endometrium is very difficult to visualize as an accurate measurement cannot be obtained. Hypoechoic appearance to the endometrium seen only on the transabdominal images which may represent moderate complicated fluid. Recommend gyn consultation for further evaluation as  sonohysterography may be helpful to better evaluate the endometrium.  Electronically Signed   By: Elberta Fortis M.D.   On: 01/05/2023 16:47  Endometrial Biopsy Procedure  Patient identified, informed consent performed,  indication reviewed, consent signed.  Reviewed risk of perforation, pain, bleeding, insufficient sample, etc were reviewd. Time out was performed.  Urine pregnancy test negative.  Speculum placed in the vagina.  Cervix visualized.  Cleaned with Betadine x 2.  Anterior cervix grasped anteriorly with a single tooth tenaculum.  Paracervical block was not administered.  Endometrial pipelle was passed twice without difficulty and sample obtained. Tenaculum was removed, good hemostasis noted.  Patient tolerated procedure well.  Patient was given post-procedure instructions.      Assessment & Plan:   1. Menorrhagia with regular cycle Discussed medical and surgical options for AUB management. Patient would like to proceed with definitive management via hysterectomy. Now s/p uncomplicated EMB. CBC ordered for next month to assess status of anemia and if preop correction needed. Continue aygestin in the interim for control of menses until surgery completed. Has completed financial application as well.  - CBC; Future - Surgical pathology - Cytology -  PAP  2. Screening for cervical cancer Routine pap collected - - Cytology - PAP  Patient desires surgical management with RA-TLH, BS, cysto.  The risks of surgery were discussed in detail with the patient including but not limited to: bleeding which may require transfusion or reoperation; infection which may require prolonged hospitalization or re-hospitalization and antibiotic therapy; injury to bowel, bladder, ureters and major vessels or other surrounding organs which may lead to other procedures; formation of adhesions; need for additional procedures including laparotomy or subsequent procedures secondary to intraoperative injury or  abnormal pathology; thromboembolic phenomenon; incisional problems and other postoperative or anesthesia complications.  Patient was told that the likelihood that her condition and symptoms will be treated effectively with this surgical management was very high; the postoperative expectations were also discussed in detail. The patient also understands the alternative treatment options which were discussed in full. All questions were answered.  She was told that she will be contacted by our surgical scheduler regarding the time and date of her surgery; routine preoperative instructions will be given to her by the preoperative nursing team.  Printed patient education handouts about the procedure were given to the patient to review at home.   Lorriane Shire, MD Minimally Invasive Gynecologic [facility], [facility]

## 2023-02-14 LAB — SURGICAL PATHOLOGY

## 2023-02-17 LAB — CYTOLOGY - PAP
Comment: NEGATIVE
Diagnosis: NEGATIVE
High risk HPV: NEGATIVE

## 2023-02-18 ENCOUNTER — Telehealth: Payer: Self-pay | Admitting: Family Medicine

## 2023-02-20 ENCOUNTER — Encounter (HOSPITAL_BASED_OUTPATIENT_CLINIC_OR_DEPARTMENT_OTHER): Payer: Self-pay | Admitting: Obstetrics and Gynecology

## 2023-02-20 ENCOUNTER — Other Ambulatory Visit: Payer: Self-pay

## 2023-02-20 DIAGNOSIS — N920 Excessive and frequent menstruation with regular cycle: Secondary | ICD-10-CM

## 2023-02-20 NOTE — Progress Notes (Addendum)
Your procedure is scheduled on :  Tuesday,  02-25-2023  Report to [facility] AT  _6:45__ AM.   Call this number if you have problems the morning of surgery  :[phone].   OUR ADDRESS IS [address].  WE ARE LOCATED IN [facility].  PLEASE BRING YOUR INSURANCE CARD AND PHOTO ID DAY OF SURGERY.  ONLY 2 PEOPLE ARE ALLOWED IN  WAITING  ROOM                                      REMEMBER:  DO NOT EAT FOOD OR DRINK AFTER MIDNIGHT THE NIGHT BEFORE YOUR SURGERY .  YOU MAY  BRUSH YOUR TEETH MORNING OF SURGERY AND RINSE YOUR MOUTH OUT, NO CHEWING GUM CANDY OR MINTS. _____________________________________________________________________     TAKE ONLY THESE MEDICATIONS MORNING OF SURGERY:  Norethindrone                                      DO NOT WEAR JEWERLY/  METAL/  PIERCINGS (INCLUDING NO PLASTIC PIERCINGS) DO NOT WEAR LOTIONS, POWDERS, PERFUMES OR NAIL POLISH ON YOUR FINGERNAILS. TOENAIL POLISH IS OK TO WEAR. DO NOT SHAVE FOR 48 HOURS PRIOR TO DAY OF SURGERY.  CONTACTS, GLASSES, OR DENTURES MAY NOT BE WORN TO SURGERY.  REMEMBER: NO SMOKING, VAPING ,  DRUGS OR ALCOHOL FOR 24 HOURS BEFORE YOUR SURGERY.                                    [facility] IS NOT RESPONSIBLE  FOR ANY BELONGINGS.                                                                    Marland Kitchen           [facility] - Preparing for Surgery Before surgery, you can play an important role.  Because skin is not sterile, your skin needs to be as free of germs as possible.  You can reduce the number of germs on your skin by washing with CHG (chlorahexidine gluconate) soap before surgery.  CHG is an antiseptic cleaner which kills germs and bonds with the skin to continue killing germs even after washing. Please DO NOT use if you have an allergy to CHG or antibacterial soaps.  If your skin becomes reddened/irritated stop using the CHG and inform your nurse when you arrive at Short Stay. Do  not shave (including legs and underarms) for at least 48 hours prior to the first CHG shower.  You may shave your face/neck. Please follow these instructions carefully:  1.  Shower with CHG Soap the night before surgery and the  morning of Surgery.  2.  If you choose to wash your hair, wash your hair first as usual with your  normal  shampoo.  3.  After you shampoo, rinse your hair and body thoroughly to remove the  shampoo.  4.  Use CHG as you would any other liquid soap.  You can apply chg directly  to the skin and wash , chg soap provided, night before and morning of your surgery.  5.  Apply the CHG Soap to your body ONLY FROM THE NECK DOWN.   Do not use on face/ open                           Wound or open sores. Avoid contact with eyes, ears mouth and genitals (private parts).                       Wash face,  Genitals (private parts) with your normal soap.             6.  Wash thoroughly, paying special attention to the area where your surgery  will be performed.  7.  Thoroughly rinse your body with warm water from the neck down.  8.  DO NOT shower/wash with your normal soap after using and rinsing off  the CHG Soap.             9.  Pat yourself dry with a clean towel.            10.  Wear clean pajamas.            11.  Place clean sheets on your bed the night of your first shower and do not  sleep with pets. Day of Surgery : Do not apply any lotions/ powders the morning of surgery.  Please wear clean clothes to the hospital/surgery center.  IF YOU HAVE ANY SKIN IRRITATION OR PROBLEMS WITH THE SURGICAL SOAP, PLEASE GET A BAR OF GOLD DIAL SOAP AND SHOWER THE NIGHT BEFORE YOUR SURGERY AND THE MORNING OF YOUR SURGERY. PLEASE LET THE NURSE KNOW MORNING OF YOUR SURGERY IF YOU HAD ANY PROBLEMS WITH THE SURGICAL SOAP.   YOUR SURGEON MAY HAVE REQUESTED EXTENDED RECOVERY TIME AFTER YOUR SURGERY. IT COULD BE A  JUST A FEW HOURS  UP TO AN OVERNIGHT STAY.   YOUR  SURGEON SHOULD HAVE DISCUSSED THIS WITH YOU PRIOR TO YOUR SURGERY. IN THE EVENT YOU NEED TO STAY OVERNIGHT PLEASE REFER TO THE FOLLOWING GUIDELINES.  YOU MAY HAVE UP TO 4 VISITORS  MAY VISIT IN THE EXTENDED RECOVERY ROOM UNTIL 8:00 PM ONLY.  ONE  VISITOR AGE 23 AND OVER MAY SPEND THE NIGHT AND MUST BE IN EXTENDED RECOVERY ROOM NO LATER THAN 8:00 PM .  YOUR DISCHARGE TIME AFTER YOU SPEND THE NIGHT IS 9:00 AM THE MORNING AFTER YOUR SURGERY. YOU MAY PACK A SMALL OVERNIGHT BAG WITH TOILETRIES FOR YOUR OVERNIGHT STAY IF YOU WISH.  REGARDLESS OF IF YOU STAY OVER NIGHT OR ARE DISCHARGED THE SAME DAY YOU WILL BE REQUIRED TO HAVE A RESPONSIBLE ADULT (18 YRS OLD OR OLDER) STAY WITH YOU FOR AT LEAST THE FIRST 24 HOURS AFTER YOU ARE DISCHARGED  YOUR PRESCRIPTION MEDICATIONS WILL BE PROVIDED DURING YOUR HOSPITAL STAY.  ________________________________________________________________________                                                        QUESTIONS Autumn Duncan PRE OP NURSE PHONE [phone]

## 2023-02-20 NOTE — Progress Notes (Addendum)
Spoke w/ via phone for pre-op interview--- pt , thru [place] interpreter ID # [id number] Lab needs dos---- T&S              Lab results------ pt is having CBC done at Dr Briscoe Deutscher office today 02-20-2023, result in epic COVID test -----patient states asymptomatic no test needed Arrive at ------- 0645 on 02-25-2023 NPO after MN  Med rec completed Medications to take morning of surgery ----- norethindrone Diabetic medication ----- n/a Patient instructed no nail polish to be worn day of surgery Patient instructed to bring photo id and insurance card day of surgery Patient aware to have Driver (ride ) / caregiver    for 24 hours after surgery -- per pt her children Pt told only 2 visitor in waiting area and 4 in [facility] Patient Special Instructions ----- pt will is picking up bag with instrucitons and hibiclens soap after lab appt at Dr Briscoe Deutscher office this afternoon from front desk @ [facility].  Pt stated she knows/ has someone that reads english and can interpret instructions for her. Pt verbalized understanding to call back when she gets instructions to ask any questions and confirm she understands the instructions. Case just added on today, pre-op orders pending Pre-Op special Instructions -----  pt requested interpreter dos w/ no gender preference.  Sent request via email to [employer] interpreting, copy w/ chart. Patient verbalized understanding of instructions that were given at this phone interview. Patient denies shortness of breath, chest pain, fever, cough at this phone interview.

## 2023-02-21 LAB — CBC
Hematocrit: 43 % (ref 34.0–46.6)
Hemoglobin: 12.8 g/dL (ref 11.1–15.9)
MCH: 27.4 pg (ref 26.6–33.0)
MCHC: 29.8 g/dL — ABNORMAL LOW (ref 31.5–35.7)
MCV: 92 fL (ref 79–97)
Platelets: 383 10*3/uL (ref 150–450)
RBC: 4.67 x10E6/uL (ref 3.77–5.28)
RDW: 14.4 % (ref 11.7–15.4)
WBC: 5.5 10*3/uL (ref 3.4–10.8)

## 2023-02-24 NOTE — Progress Notes (Signed)
Patient was supposed to come in to [facility] front desk to pick up her bag wi/ instructions for surgery and Hibiclens soap last week. Per the front desk, she never came in to pick up her. The front desk gave her bag back to [place] in PAT. I called the patient via Spanish interpreter ID [phone] this morning to remind her to come and pick up the bag today. Patient stated that she already had the bag with the soap and instructions. She stated that someone had read and translated the instructions to her and she had the soap.  I spoke with the front desk again. They again stated that she had not come to pick up the bag. I called the patient back via interpreter ID# [phone] and she verified that she had come to [facility] at [address] and picked up the bag on Thursday. I reviewed the instructions with the patient over the phone just to make sure she had the correct address, time, and instructions. I do not know how she got the instructions and soap. But the patient confirmed that she had the instructions and soap.

## 2023-02-25 ENCOUNTER — Encounter (HOSPITAL_BASED_OUTPATIENT_CLINIC_OR_DEPARTMENT_OTHER): Payer: Self-pay | Admitting: Obstetrics and Gynecology

## 2023-02-25 ENCOUNTER — Ambulatory Visit (HOSPITAL_BASED_OUTPATIENT_CLINIC_OR_DEPARTMENT_OTHER)
Admission: RE | Admit: 2023-02-25 | Discharge: 2023-02-25 | Disposition: A | Discharge status: Home / Self Care | Payer: Self-pay | Attending: Obstetrics and Gynecology | Admitting: Obstetrics and Gynecology

## 2023-02-25 ENCOUNTER — Other Ambulatory Visit: Payer: Self-pay

## 2023-02-25 ENCOUNTER — Ambulatory Visit (HOSPITAL_BASED_OUTPATIENT_CLINIC_OR_DEPARTMENT_OTHER): Payer: Self-pay | Admitting: Anesthesiology

## 2023-02-25 ENCOUNTER — Encounter (HOSPITAL_BASED_OUTPATIENT_CLINIC_OR_DEPARTMENT_OTHER)
Admission: RE | Disposition: A | Discharge status: Home / Self Care | Payer: Self-pay | Source: Home / Self Care | Attending: Obstetrics and Gynecology

## 2023-02-25 DIAGNOSIS — N80102 Endometriosis of left ovary, unspecified depth: Secondary | ICD-10-CM | POA: Insufficient documentation

## 2023-02-25 DIAGNOSIS — D251 Intramural leiomyoma of uterus: Secondary | ICD-10-CM

## 2023-02-25 DIAGNOSIS — N920 Excessive and frequent menstruation with regular cycle: Secondary | ICD-10-CM

## 2023-02-25 DIAGNOSIS — D259 Leiomyoma of uterus, unspecified: Secondary | ICD-10-CM

## 2023-02-25 DIAGNOSIS — D759 Disease of blood and blood-forming organs, unspecified: Secondary | ICD-10-CM | POA: Insufficient documentation

## 2023-02-25 DIAGNOSIS — N888 Other specified noninflammatory disorders of cervix uteri: Secondary | ICD-10-CM | POA: Insufficient documentation

## 2023-02-25 DIAGNOSIS — Z01818 Encounter for other preprocedural examination: Secondary | ICD-10-CM

## 2023-02-25 DIAGNOSIS — N939 Abnormal uterine and vaginal bleeding, unspecified: Secondary | ICD-10-CM | POA: Diagnosis present

## 2023-02-25 DIAGNOSIS — N841 Polyp of cervix uteri: Secondary | ICD-10-CM | POA: Insufficient documentation

## 2023-02-25 DIAGNOSIS — D250 Submucous leiomyoma of uterus: Secondary | ICD-10-CM

## 2023-02-25 DIAGNOSIS — N83202 Unspecified ovarian cyst, left side: Secondary | ICD-10-CM

## 2023-02-25 DIAGNOSIS — N736 Female pelvic peritoneal adhesions (postinfective): Secondary | ICD-10-CM | POA: Insufficient documentation

## 2023-02-25 DIAGNOSIS — D649 Anemia, unspecified: Secondary | ICD-10-CM | POA: Insufficient documentation

## 2023-02-25 DIAGNOSIS — D630 Anemia in neoplastic disease: Secondary | ICD-10-CM

## 2023-02-25 DIAGNOSIS — M542 Cervicalgia: Secondary | ICD-10-CM

## 2023-02-25 HISTORY — DX: Anemia, unspecified: D64.9

## 2023-02-25 HISTORY — DX: Abnormal uterine and vaginal bleeding, unspecified: N93.9

## 2023-02-25 HISTORY — DX: Leiomyoma of uterus, unspecified: D25.9

## 2023-02-25 HISTORY — PX: ROBOTIC ASSISTED LAPAROSCOPIC OVARIAN CYSTECTOMY: SHX6081

## 2023-02-25 HISTORY — PX: CYSTOSCOPY: SHX5120

## 2023-02-25 HISTORY — DX: Iron deficiency anemia secondary to blood loss (chronic): D50.0

## 2023-02-25 HISTORY — PX: ROBOTIC ASSISTED TOTAL HYSTERECTOMY: SHX6085

## 2023-02-25 LAB — TYPE AND SCREEN
ABO/RH(D): O POS
Antibody Screen: NEGATIVE

## 2023-02-25 LAB — POCT PREGNANCY, URINE: Preg Test, Ur: NEGATIVE

## 2023-02-25 SURGERY — HYSTERECTOMY, TOTAL, ROBOT-ASSISTED
Anesthesia: General | Site: Urethra

## 2023-02-25 MED ORDER — ACETAMINOPHEN 500 MG PO TABS
1000.0000 mg | ORAL_TABLET | ORAL | Status: AC
  Administered 2023-02-25: 1000 mg via ORAL

## 2023-02-25 MED ORDER — ROCURONIUM BROMIDE 10 MG/ML (PF) SYRINGE
PREFILLED_SYRINGE | INTRAVENOUS | Status: DC | PRN
  Administered 2023-02-25: 20 mg via INTRAVENOUS
  Administered 2023-02-25: 70 mg via INTRAVENOUS

## 2023-02-25 MED ORDER — ACETAMINOPHEN 500 MG PO TABS
1000.0000 mg | ORAL_TABLET | Freq: Four times a day (QID) | ORAL | Status: DC
  Administered 2023-02-25: 1000 mg via ORAL

## 2023-02-25 MED ORDER — MIDAZOLAM HCL 2 MG/2ML IJ SOLN
INTRAMUSCULAR | Status: AC
  Filled 2023-02-25: qty 2

## 2023-02-25 MED ORDER — FENTANYL CITRATE (PF) 100 MCG/2ML IJ SOLN
INTRAMUSCULAR | Status: AC
  Filled 2023-02-25: qty 2

## 2023-02-25 MED ORDER — OXYCODONE HCL 5 MG/5ML PO SOLN: 5.0000 mg | Freq: Once | ORAL | Status: AC | PRN

## 2023-02-25 MED ORDER — KETAMINE HCL 10 MG/ML IJ SOLN
INTRAMUSCULAR | Status: DC | PRN
  Administered 2023-02-25 (×2): 10 mg via INTRAVENOUS
  Administered 2023-02-25: 30 mg via INTRAVENOUS

## 2023-02-25 MED ORDER — DEXAMETHASONE SODIUM PHOSPHATE 10 MG/ML IJ SOLN
INTRAMUSCULAR | Status: DC | PRN
  Administered 2023-02-25: 10 mg via INTRAVENOUS

## 2023-02-25 MED ORDER — LIDOCAINE 20MG/ML (2%) 15 ML SYRINGE OPTIME
INTRAMUSCULAR | Status: DC | PRN
  Administered 2023-02-25: 1.5 mg/kg/h via INTRAVENOUS

## 2023-02-25 MED ORDER — PROPOFOL 10 MG/ML IV BOLUS
INTRAVENOUS | Status: AC
  Filled 2023-02-25: qty 20

## 2023-02-25 MED ORDER — KETOROLAC TROMETHAMINE 30 MG/ML IJ SOLN
30.0000 mg | Freq: Once | INTRAMUSCULAR | Status: AC | PRN
  Administered 2023-02-25: 30 mg via INTRAVENOUS

## 2023-02-25 MED ORDER — GABAPENTIN 300 MG PO CAPS
ORAL_CAPSULE | ORAL | Status: AC
  Filled 2023-02-25: qty 1

## 2023-02-25 MED ORDER — GABAPENTIN 300 MG PO CAPS
300.0000 mg | ORAL_CAPSULE | ORAL | Status: AC
  Administered 2023-02-25: 300 mg via ORAL

## 2023-02-25 MED ORDER — POVIDONE-IODINE 10 % EX SWAB: 2.0000 | Freq: Once | CUTANEOUS | Status: DC

## 2023-02-25 MED ORDER — HYDROMORPHONE HCL 1 MG/ML IJ SOLN
INTRAMUSCULAR | Status: DC | PRN
  Administered 2023-02-25: 1 mg via INTRAVENOUS
  Administered 2023-02-25 (×2): .5 mg via INTRAVENOUS

## 2023-02-25 MED ORDER — OXYCODONE HCL 5 MG PO TABS: 5.0000 mg | ORAL_TABLET | ORAL | 0 refills | Status: DC | PRN

## 2023-02-25 MED ORDER — POLYETHYLENE GLYCOL 3350 17 G PO PACK: 17.0000 g | PACK | Freq: Every day | ORAL | Status: DC | PRN

## 2023-02-25 MED ORDER — OXYCODONE HCL 5 MG PO TABS
5.0000 mg | ORAL_TABLET | Freq: Once | ORAL | Status: AC | PRN
  Administered 2023-02-25: 5 mg via ORAL

## 2023-02-25 MED ORDER — MEPERIDINE HCL 25 MG/ML IJ SOLN: 6.2500 mg | INTRAMUSCULAR | Status: DC | PRN

## 2023-02-25 MED ORDER — HYDROMORPHONE HCL 1 MG/ML IJ SOLN
INTRAMUSCULAR | Status: AC
  Filled 2023-02-25: qty 1

## 2023-02-25 MED ORDER — CEFAZOLIN SODIUM-DEXTROSE 2-4 GM/100ML-% IV SOLN
INTRAVENOUS | Status: AC
  Filled 2023-02-25: qty 100

## 2023-02-25 MED ORDER — DOCUSATE SODIUM 100 MG PO CAPS
100.0000 mg | ORAL_CAPSULE | Freq: Two times a day (BID) | ORAL | Status: DC
  Administered 2023-02-25: 100 mg via ORAL

## 2023-02-25 MED ORDER — IBUPROFEN 600 MG PO TABS
600.0000 mg | ORAL_TABLET | Freq: Four times a day (QID) | ORAL | 1 refills | Status: AC | PRN
Start: 2023-02-25 — End: ?

## 2023-02-25 MED ORDER — POLYETHYLENE GLYCOL 3350 17 G PO PACK: 17.0000 g | PACK | Freq: Every day | ORAL | 1 refills | Status: AC

## 2023-02-25 MED ORDER — HYDROMORPHONE HCL 2 MG/ML IJ SOLN
INTRAMUSCULAR | Status: AC
  Filled 2023-02-25: qty 1

## 2023-02-25 MED ORDER — ONDANSETRON HCL 4 MG/2ML IJ SOLN
INTRAMUSCULAR | Status: DC | PRN
  Administered 2023-02-25: 4 mg via INTRAVENOUS

## 2023-02-25 MED ORDER — SIMETHICONE 80 MG PO CHEW: 80.0000 mg | CHEWABLE_TABLET | Freq: Four times a day (QID) | ORAL | Status: DC | PRN

## 2023-02-25 MED ORDER — MIDAZOLAM HCL 5 MG/5ML IJ SOLN
INTRAMUSCULAR | Status: DC | PRN
  Administered 2023-02-25: 2 mg via INTRAVENOUS

## 2023-02-25 MED ORDER — 0.9 % SODIUM CHLORIDE (POUR BTL) OPTIME
TOPICAL | Status: DC | PRN
  Administered 2023-02-25: 1000 mL

## 2023-02-25 MED ORDER — KETAMINE HCL 50 MG/5ML IJ SOSY
PREFILLED_SYRINGE | INTRAMUSCULAR | Status: AC
  Filled 2023-02-25: qty 5

## 2023-02-25 MED ORDER — OXYCODONE HCL 5 MG PO TABS
ORAL_TABLET | ORAL | Status: AC
  Filled 2023-02-25: qty 1

## 2023-02-25 MED ORDER — PROPOFOL 10 MG/ML IV BOLUS
INTRAVENOUS | Status: DC | PRN
  Administered 2023-02-25: 50 mg via INTRAVENOUS
  Administered 2023-02-25: 150 mg via INTRAVENOUS

## 2023-02-25 MED ORDER — LIDOCAINE 2% (20 MG/ML) 5 ML SYRINGE
INTRAMUSCULAR | Status: DC | PRN
  Administered 2023-02-25: 60 mg via INTRAVENOUS

## 2023-02-25 MED ORDER — CEFAZOLIN SODIUM-DEXTROSE 2-4 GM/100ML-% IV SOLN
2.0000 g | INTRAVENOUS | Status: AC
  Administered 2023-02-25: 2 g via INTRAVENOUS

## 2023-02-25 MED ORDER — ACETAMINOPHEN 500 MG PO TABS: 1000.0000 mg | ORAL_TABLET | Freq: Once | ORAL | Status: DC

## 2023-02-25 MED ORDER — HYDROMORPHONE HCL 1 MG/ML IJ SOLN
0.2500 mg | INTRAMUSCULAR | Status: DC | PRN
  Administered 2023-02-25 (×2): 0.5 mg via INTRAVENOUS

## 2023-02-25 MED ORDER — KETOROLAC TROMETHAMINE 30 MG/ML IJ SOLN
INTRAMUSCULAR | Status: AC
  Filled 2023-02-25: qty 1

## 2023-02-25 MED ORDER — KETOROLAC TROMETHAMINE 30 MG/ML IJ SOLN: 30.0000 mg | Freq: Four times a day (QID) | INTRAMUSCULAR | Status: DC

## 2023-02-25 MED ORDER — ONDANSETRON HCL 4 MG/2ML IJ SOLN: 4.0000 mg | Freq: Four times a day (QID) | INTRAMUSCULAR | Status: DC | PRN

## 2023-02-25 MED ORDER — DOCUSATE SODIUM 100 MG PO CAPS
ORAL_CAPSULE | ORAL | Status: AC
  Filled 2023-02-25: qty 1

## 2023-02-25 MED ORDER — FENTANYL CITRATE (PF) 100 MCG/2ML IJ SOLN
INTRAMUSCULAR | Status: DC | PRN
  Administered 2023-02-25 (×2): 50 ug via INTRAVENOUS

## 2023-02-25 MED ORDER — DEXAMETHASONE SODIUM PHOSPHATE 10 MG/ML IJ SOLN
INTRAMUSCULAR | Status: AC
  Filled 2023-02-25: qty 1

## 2023-02-25 MED ORDER — LIDOCAINE HCL 2 % IJ SOLN
INTRAMUSCULAR | Status: AC
  Filled 2023-02-25: qty 20

## 2023-02-25 MED ORDER — LACTATED RINGERS IV SOLN: INTRAVENOUS | Status: DC

## 2023-02-25 MED ORDER — ONDANSETRON HCL 4 MG/2ML IJ SOLN
INTRAMUSCULAR | Status: AC
  Filled 2023-02-25: qty 2

## 2023-02-25 MED ORDER — AMISULPRIDE (ANTIEMETIC) 5 MG/2ML IV SOLN: 10.0000 mg | Freq: Once | INTRAVENOUS | Status: DC | PRN

## 2023-02-25 MED ORDER — FLUORESCEIN SODIUM 10 % IV SOLN
INTRAVENOUS | Status: AC
  Filled 2023-02-25: qty 5

## 2023-02-25 MED ORDER — FLUORESCEIN SODIUM 10 % IV SOLN
INTRAVENOUS | Status: DC | PRN
  Administered 2023-02-25: 100 mg via INTRAVENOUS

## 2023-02-25 MED ORDER — SUGAMMADEX SODIUM 200 MG/2ML IV SOLN
INTRAVENOUS | Status: DC | PRN
  Administered 2023-02-25: 200 mg via INTRAVENOUS

## 2023-02-25 MED ORDER — ACETAMINOPHEN 500 MG PO TABS
ORAL_TABLET | ORAL | Status: AC
  Filled 2023-02-25: qty 2

## 2023-02-25 MED ORDER — SCOPOLAMINE 1 MG/3DAYS TD PT72
1.0000 | MEDICATED_PATCH | TRANSDERMAL | Status: DC
  Administered 2023-02-25: 1.5 mg via TRANSDERMAL

## 2023-02-25 MED ORDER — IBUPROFEN 200 MG PO TABS: 600.0000 mg | ORAL_TABLET | Freq: Four times a day (QID) | ORAL | Status: DC

## 2023-02-25 MED ORDER — DEXMEDETOMIDINE HCL IN NACL 80 MCG/20ML IV SOLN
INTRAVENOUS | Status: DC | PRN
  Administered 2023-02-25 (×2): 4 ug via INTRAVENOUS

## 2023-02-25 MED ORDER — OXYCODONE HCL 5 MG PO TABS: 5.0000 mg | ORAL_TABLET | ORAL | Status: DC | PRN

## 2023-02-25 MED ORDER — PHENYLEPHRINE 80 MCG/ML (10ML) SYRINGE FOR IV PUSH (FOR BLOOD PRESSURE SUPPORT)
PREFILLED_SYRINGE | INTRAVENOUS | Status: AC
  Filled 2023-02-25: qty 10

## 2023-02-25 MED ORDER — DEXMEDETOMIDINE HCL IN NACL 80 MCG/20ML IV SOLN
INTRAVENOUS | Status: AC
  Filled 2023-02-25: qty 20

## 2023-02-25 MED ORDER — ONDANSETRON HCL 4 MG PO TABS: 4.0000 mg | ORAL_TABLET | Freq: Four times a day (QID) | ORAL | Status: DC | PRN

## 2023-02-25 MED ORDER — BUPIVACAINE HCL (PF) 0.5 % IJ SOLN
INTRAMUSCULAR | Status: DC | PRN
  Administered 2023-02-25: 20 mL

## 2023-02-25 MED ORDER — ACETAMINOPHEN 500 MG PO TABS: 1000.0000 mg | ORAL_TABLET | Freq: Four times a day (QID) | ORAL | 1 refills | Status: AC | PRN

## 2023-02-25 MED ORDER — ONDANSETRON HCL 4 MG/2ML IJ SOLN: 4.0000 mg | Freq: Once | INTRAMUSCULAR | Status: DC | PRN

## 2023-02-25 MED ORDER — SCOPOLAMINE 1 MG/3DAYS TD PT72
MEDICATED_PATCH | TRANSDERMAL | Status: AC
  Filled 2023-02-25: qty 1

## 2023-02-25 MED ORDER — HYDROMORPHONE HCL 1 MG/ML IJ SOLN: 0.2000 mg | INTRAMUSCULAR | Status: DC | PRN

## 2023-02-25 MED ORDER — SODIUM CHLORIDE 0.9 % IR SOLN
Status: DC | PRN
  Administered 2023-02-25 (×2): 1000 mL

## 2023-02-25 MED ORDER — ROCURONIUM BROMIDE 10 MG/ML (PF) SYRINGE
PREFILLED_SYRINGE | INTRAVENOUS | Status: AC
  Filled 2023-02-25: qty 10

## 2023-02-25 SURGICAL SUPPLY — 66 items
ADH SKN CLS APL DERMABOND .7 (GAUZE/BANDAGES/DRESSINGS) ×3
BLADE SURG 10 STRL SS (BLADE) IMPLANT
COVER BACK TABLE 60X90IN (DRAPES) ×3 IMPLANT
COVER TIP SHEARS 8 DVNC (MISCELLANEOUS) ×3 IMPLANT
DEFOGGER SCOPE WARMER CLEARIFY (MISCELLANEOUS) ×3 IMPLANT
DERMABOND ADVANCED .7 DNX12 (GAUZE/BANDAGES/DRESSINGS) ×3 IMPLANT
DRAPE ARM DVNC X/XI (DISPOSABLE) ×9 IMPLANT
DRAPE COLUMN DVNC XI (DISPOSABLE) ×3 IMPLANT
DRAPE SURG IRRIG POUCH 19X23 (DRAPES) ×3 IMPLANT
DRAPE UTILITY XL STRL (DRAPES) ×3 IMPLANT
DRIVER NDL MEGA SUTCUT DVNCXI (INSTRUMENTS) ×3 IMPLANT
DRIVER NDLE MEGA SUTCUT DVNCXI (INSTRUMENTS) ×3 IMPLANT
DURAPREP 26ML APPLICATOR (WOUND CARE) ×3 IMPLANT
ELECT REM PT RETURN 9FT ADLT (ELECTROSURGICAL) ×3
ELECTRODE REM PT RTRN 9FT ADLT (ELECTROSURGICAL) ×3 IMPLANT
FORCEPS LONG TIP 8 DVNC XI (FORCEP) ×3 IMPLANT
FORCEPS PROGRASP DVNC XI (FORCEP) ×3 IMPLANT
GAUZE 4X4 16PLY ~~LOC~~+RFID DBL (SPONGE) IMPLANT
GLOVE BIO SURGEON STRL SZ7 (GLOVE) ×9 IMPLANT
GLOVE BIOGEL PI IND STRL 6 (GLOVE) IMPLANT
GLOVE BIOGEL PI IND STRL 7.0 (GLOVE) ×9 IMPLANT
GLOVE SURG SS PI 7.0 STRL IVOR (GLOVE) IMPLANT
GLOVE SURG SS PI 7.5 STRL IVOR (GLOVE) IMPLANT
GOWN SRG XL LVL 4 BRTHBL STRL (GOWNS) IMPLANT
GOWN STRL NON-REIN XL LVL4 (GOWNS) ×6
GOWN STRL REUS W/ TWL XL LVL3 (GOWN DISPOSABLE) ×9 IMPLANT
GOWN STRL REUS W/TWL 2XL LVL3 (GOWN DISPOSABLE) IMPLANT
GOWN STRL REUS W/TWL LRG LVL3 (GOWN DISPOSABLE) IMPLANT
GOWN STRL REUS W/TWL XL LVL3 (GOWN DISPOSABLE) ×9
IRRIG SUCT STRYKERFLOW 2 WTIP (MISCELLANEOUS) ×3
IRRIGATION SUCT STRKRFLW 2 WTP (MISCELLANEOUS) ×3 IMPLANT
IV NS 1000ML (IV SOLUTION) ×6
IV NS 1000ML BAXH (IV SOLUTION) IMPLANT
KIT PINK PAD W/HEAD ARE REST (MISCELLANEOUS) ×3
KIT PINK PAD W/HEAD ARM REST (MISCELLANEOUS) ×3 IMPLANT
KIT TURNOVER CYSTO (KITS) ×3 IMPLANT
LEGGING LITHOTOMY PAIR STRL (DRAPES) ×3 IMPLANT
NDL INSUFFLATION 14GA 120MM (NEEDLE) ×3 IMPLANT
NEEDLE INSUFFLATION 14GA 120MM (NEEDLE) IMPLANT
NS IRRIG 500ML POUR BTL (IV SOLUTION) IMPLANT
OBTURATOR OPTICAL STND 8 DVNC (TROCAR) ×3
OBTURATOR OPTICALSTD 8 DVNC (TROCAR) ×3 IMPLANT
PACK ROBOT WH (CUSTOM PROCEDURE TRAY) ×3 IMPLANT
PAD OB MATERNITY 4.3X12.25 (PERSONAL CARE ITEMS) ×3 IMPLANT
PAD PREP 24X48 CUFFED NSTRL (MISCELLANEOUS) ×3 IMPLANT
PROTECTOR NERVE ULNAR (MISCELLANEOUS) ×6 IMPLANT
RUMI II 3.0CM BLUE KOH-EFFICIE (DISPOSABLE) IMPLANT
SCISSORS MNPLR CVD DVNC XI (INSTRUMENTS) ×3 IMPLANT
SEAL UNIV 5-12 XI (MISCELLANEOUS) ×9 IMPLANT
SEALER VESSEL EXT DVNC XI (MISCELLANEOUS) IMPLANT
SET IRRIG Y TYPE TUR BLADDER L (SET/KITS/TRAYS/PACK) IMPLANT
SET TRI-LUMEN FLTR TB AIRSEAL (TUBING) ×3 IMPLANT
SLEEVE SCD COMPRESS KNEE MED (STOCKING) ×3 IMPLANT
SPIKE FLUID TRANSFER (MISCELLANEOUS) ×3 IMPLANT
SUT VIC AB 0 CT1 27 (SUTURE) ×6
SUT VIC AB 0 CT1 27XBRD ANBCTR (SUTURE) IMPLANT
SUT VIC AB 3-0 SH 27 (SUTURE) ×6
SUT VIC AB 3-0 SH 27X BRD (SUTURE) IMPLANT
SUT VIC AB 3-0 SH 27XBRD (SUTURE) IMPLANT
SUT VIC AB 4-0 PS2 18 (SUTURE) ×6 IMPLANT
SUT VLOC 180 0 9IN  GS21 (SUTURE) ×3
SUT VLOC 180 0 9IN GS21 (SUTURE) ×3 IMPLANT
TIP RUMI ORANGE 6.7MMX12CM (TIP) ×3 IMPLANT
TOWEL OR 17X24 6PK STRL BLUE (TOWEL DISPOSABLE) ×3 IMPLANT
TRAY FOLEY W/BAG SLVR 14FR LF (SET/KITS/TRAYS/PACK) ×3 IMPLANT
TROCAR PORT AIRSEAL 5X120 (TROCAR) ×3 IMPLANT

## 2023-02-25 NOTE — Interval H&P Note (Signed)
History and Physical Interval Note:  02/25/2023 7:43 AM  Autumn Duncan Aftan Pliler  has presented today for surgery, with the diagnosis of AUB Fibroids.  The various methods of treatment have been discussed with the patient and family. After consideration of risks, benefits and other options for treatment, the patient has consented to  Procedure(s): XI ROBOTIC ASSISTED TOTAL HYSTERECTOMY WITH SALPINGECTOMY (Bilateral) CYSTOSCOPY (N/A) as a surgical intervention.  The patient's history has been reviewed, patient examined, no change in status, stable for surgery.  I have reviewed the patient's chart and labs.  Questions were answered to the patient's satisfaction.     [name]

## 2023-02-25 NOTE — Anesthesia Postprocedure Evaluation (Signed)
Anesthesia Post Note  Patient: Autumn Duncan  Procedure(s) Performed: XI ROBOTIC ASSISTED TOTAL HYSTERECTOMY WITH SALPINGECTOMY (Bilateral: Abdomen) CYSTOSCOPY (Urethra) XI ROBOTIC ASSISTED LAPAROSCOPIC OVARIAN CYSTECTOMY (Left: Abdomen)     Patient location during evaluation: PACU Anesthesia Type: General Level of consciousness: awake and alert, oriented and patient cooperative Pain management: pain level controlled Vital Signs Assessment: post-procedure vital signs reviewed and stable Respiratory status: spontaneous breathing, nonlabored ventilation and respiratory function stable Cardiovascular status: blood pressure returned to baseline and stable Postop Assessment: no apparent nausea or vomiting Anesthetic complications: no   No notable events documented.  Last Vitals:  Vitals:   02/25/23 1115 02/25/23 1126  BP: 137/68   Pulse: 79 78  Resp: 12 11  Temp:    SpO2: 100% 100%    Last Pain:  Vitals:   02/25/23 0713  TempSrc: Oral  PainSc: 0-No pain                 Lannie Fields

## 2023-02-25 NOTE — OR Nursing (Signed)
Interpretor at [employer]

## 2023-02-25 NOTE — Brief Op Note (Signed)
02/25/2023  10:49 AM  PATIENT:  Autumn Duncan  52 y.o. female  PRE-OPERATIVE DIAGNOSIS:  AUB Fibroids  POST-OPERATIVE DIAGNOSIS:  AUB Fibroids  PROCEDURE:  Procedure(s): XI ROBOTIC ASSISTED TOTAL HYSTERECTOMY WITH SALPINGECTOMY (Bilateral) CYSTOSCOPY (N/A) XI ROBOTIC ASSISTED LAPAROSCOPIC OVARIAN CYSTECTOMY (Left)  SURGEON:  Surgeon(s) and Role:    Lorriane Shire, MD - Primary    * Lennart Pall, MD - Assisting  PHYSICIAN ASSISTANT: n/a  ASSISTANTS: noted above   ANESTHESIA:   general and paracervical block  EBL:  75 mL   BLOOD ADMINISTERED:none  DRAINS: none   LOCAL MEDICATIONS USED:  BUPIVICAINE   SPECIMEN:  Source of Specimen:  left ovarian cyst, uterus, cervix, bilateral fallopian tubes  DISPOSITION OF SPECIMEN:  PATHOLOGY  COUNTS:  YES  TOURNIQUET:  * No tourniquets in log *  DICTATION: .Note written in EPIC  PLAN OF CARE: Discharge to home after PACU  PATIENT DISPOSITION:  PACU - hemodynamically stable.   Delay start of Pharmacological VTE agent (>24hrs) due to surgical blood loss or risk of bleeding: not applicable

## 2023-02-25 NOTE — Anesthesia Preprocedure Evaluation (Signed)
Anesthesia Evaluation  Patient identified by MRN, date of birth, ID band Patient awake    Reviewed: Allergy & Precautions, H&P , NPO status , Patient's Chart, lab work & pertinent test results  Airway Mallampati: II  TM Distance: >3 FB Neck ROM: Full    Dental no notable dental hx.    Pulmonary neg pulmonary ROS   Pulmonary exam normal breath sounds clear to auscultation       Cardiovascular negative cardio ROS Normal cardiovascular exam Rhythm:Regular Rate:Normal     Neuro/Psych negative neurological ROS  negative psych ROS   GI/Hepatic negative GI ROS, Neg liver ROS,,,  Endo/Other  negative endocrine ROS    Renal/GU negative Renal ROS  Female GU complaint     Musculoskeletal negative musculoskeletal ROS (+)    Abdominal   Peds negative pediatric ROS (+)  Hematology  (+) Blood dyscrasia, anemia Hb 12.8, plt 383 Initially presented to ED w/ syncope- Hb 5.4 in Feb 2024   Anesthesia Other Findings   Reproductive/Obstetrics negative OB ROS Urine preg neg                             Anesthesia Physical Anesthesia Plan  ASA: 2  Anesthesia Plan: General   Post-op Pain Management: Toradol IV (intra-op)*, Dilaudid IV, Ketamine IV* and Tylenol PO (pre-op)*   Induction: Intravenous  PONV Risk Score and Plan: 4 or greater and Ondansetron, Dexamethasone, Midazolam, Scopolamine patch - Pre-op and Treatment may vary due to age or medical condition  Airway Management Planned: Oral ETT  Additional Equipment: None  Intra-op Plan:   Post-operative Plan: Extubation in OR  Informed Consent: I have reviewed the patients History and Physical, chart, labs and discussed the procedure including the risks, benefits and alternatives for the proposed anesthesia with the patient or authorized representative who has indicated his/her understanding and acceptance.     Dental advisory given  Plan  Discussed with: CRNA  Anesthesia Plan Comments:        Anesthesia Quick Evaluation

## 2023-02-25 NOTE — Op Note (Signed)
[facility] PROCEDURE DATE: 02/25/2023  PREOPERATIVE DIAGNOSIS: abnormal uterine bleeding, fibroids  POSTOPERATIVE DIAGNOSIS: abnormal uterine bleeding, fibroids, left ovarian cyst PROCEDURE:    robotic-assisted total laparoscopic hysterectomy, bilateral salpingectomy, cystoscopy, and left ovarian cystectomy SURGEON: Lorriane Shire, MD ASSISTANT:  Dr. Harvie Bridge.  An experienced assistant was required given the standard of surgical care given the complexity of the case.  This assistant was needed for exposure, dissection, suctioning, retraction, instrument exchange, and for overall help during the procedure.  INDICATIONS: 52 y.o. with AUB-L.  Risks of surgery were discussed with the patient including but not limited to: bleeding which may require transfusion; infection which may require antibiotics; injury to surrounding organs; need for additional procedures including laparotomy;  and other postoperative/anesthesia complications. Written informed consent was obtained.    FINDINGS:  Normal external genitalia, 16 wk size mobile/immobile uterus with  abnormal  contours.  Laparoscopically: normal upper abdominal survey, enlarged multifibroid uterus, surgically ligated bilateral fallopian tubes, normal right ovary, suspected endometrioma in left ovary, bilateral ureters seen, multiple adhesions to the uterus from anterior abdominal wall within anterior cul de sac, normal posterior cul de sac Cystoscopically: normal bladder wall without apparent injury, bilateral ureteral orifices, fluorescein-stained urine from bilateral ureteral orifices   ANESTHESIA: General, paracervial block INTRAVENOUS FLUIDS:  1000 ml of LR ESTIMATED BLOOD LOSS:  75 ml URINE OUTPUT: 50 ml SPECIMENS: left ovarian cyst, bilateral fallopian tubes, uterus, cervix COMPLICATIONS:  vulvar and vaginal lacerations, repaire  The risks, benefits, and alternatives of surgery were explained, understood, and accepted.  Consents were signed. All questions were answered. She was taken to the operating room and general anesthesia was applied without complication. She was placed in the dorsal lithotomy position and her abdomen and vagina were prepped and draped after she had been carefully positioned on the table. A bimanual exam revealed a 16 week size uterus that was mobile. Her adnexa were not enlarged. A Foley catheter was placed and it drained clear throughout the case. A speculum was placed and the cervix visualized. A 0 vicryl suture was stitched into the anterior lip of the cervix. The cervix was measured and the uterus was sounded to 12 cm. A Rumi uterine manipulator was placed without difficulty.  Gloves were changed and attention was turned to the abdomen. All incisions were infiltrated with local anesthetic. An 8mm incision was made in the LUQ and an optiview airseal trocar was introduced into the abdomen. The Entry was confirmed with low opening intraabdominal pressure and visualization and the abdomen was then insufflated. After good pneumoperitoneum was established, the abdomen was surveyed including the upper abdomen.She was placed in Trendelenburg position and ports were placed in appropriate positions on her abdomen to allow maximum exposure during the robotic case. Specifically, trocars were placed supraumbilically, in the LLQ, RLQ, and RUQ.  These were all placed under direct laparoscopic visualization after infiltration with local anesthetic. The robot was docked and I proceeded with a robotic portion of the case.  The pelvis was inspected and the uterus was found to be enlarged with multiple fibroids. The right fallopian tube was elevated and the mesosalpinx serially cauterized and divide and passed off for specimen. The uteroovarian ligament was cauterized and divided. The round ligament was divided. Anterior pelvic adhesions were taken down sharply and bluntly and the broad ligament divided. The ureter was  visualized. The anterior leaf of the broad ligament was divided down to the level of the colpotomy cup and the endopelvic fascia identified. The uterine vessels  were skeletonized, cauterized, divided and lateralized to the cup edge. Attention turned to the left side. The ovarian cyst was noted, the ovary was sharply incised and dark cyst contents noted. The cyst wall was removed and passed off for specimen. The left mesosalpinx was serially cauterized and divided and the fallopian tube passed off for specimen. The uteroovarian ligament and round ligament were divided. The leaves of the broad ligament were divided and the anterior leaf divided and the bladder flap completed. The posterior leaf was divided down to the level of the colpotomy cup. The uterine vessels were skeletonized, cauterized, divided and lateralized to the cup edge. The bladder was pushed out of the operative site and an anterior colpotomy was made. The colpotomy incision was extended circumferentially, following the blue outline of the Rumi manipulator. All pedicles were hemostatic.     The uterus was removed from the vagina with the fallopian tube segments. The vaginal cuff was closed with v-lock suture.  Excellent hemostasis was noted throughout.    The pelvis was irrigated. The intraabdominal pressure was lowered assess hemostasis. After determining excellent hemostasis, the robot was undocked. At this point I performed cystoscopy. The cystoscopy revealed blue ejection from both ureters.  The midline fascial incision was closed with 0 vicryl.  The skin from all of the other ports was closed with 3-0 vicryl. 30cc of 0.5% Marcaine was injected into the port sites.  The patient was then extubated and taken to recovery in stable condition.   Sponge, lap and needle counts were correct x 2.                  Lorriane Shire, MD Minimally Invasive Gynecologic Surgery and Chronic Pelvic Pain Specialist Obstetrics and  Gynecology, [facility] for [facility], [facility] 02/25/2023

## 2023-02-25 NOTE — Discharge Instructions (Addendum)
Post-surgical Instructions, Outpatient Surgery  You may expect to feel dizzy, weak, and drowsy for as long as 24 hours after receiving the medicine that made you sleep (anesthetic). For the first 24 hours after your surgery:   Do not drive a car, ride a bicycle, participate in physical activities, or take public transportation until you are done taking narcotic pain medicines or as directed by Dr. Briscoe Deutscher.  Do not drink alcohol or take tranquilizers.  Do not take medicine that has not been prescribed by your physicians.  Do not sign important papers or make important decisions while on narcotic pain medicines.  Have a responsible person with you.   CARE OF INCISION If you have a bandage, you may remove it in one day.  If there are steri-strips or dermabond, just let this loosen on its own.  You may shower on the first day after your surgery.  Do not sit in a tub bath for one week. Avoid heavy lifting (more than 10 pounds/4.5 kilograms), pushing, or pulling.  Avoid activities that may risk injury to your incisions.   PAIN MANAGEMENT Ibuprofen 800mg .  (This is the same as 4-200mg  over the counter tablets of Motrin or ibuprofen.)  Take this every 6 hours or as needed for cramping.   Acetaminophen 1000mg  (This is the same as 2-500mg  over the counter extra strength tylenol). Take this every 6 hours for the first 3 days or as needed afterwards for pain Oxycodone 5mg  For more severe pain, take one or two tablets every four to six hours as needed for pain control.  (Remember that narcotic pain medications increase your risk of constipation.  If this becomes a problem, you may take an over the counter laxative like miralax.)  DO'S AND DON'T'S Do not take a tub bath for 8 weeks.  You may shower on the first day after your surgery Do not do any heavy lifting for one to two weeks.  This increases the chance of bleeding. Do move around as you feel able.  Stairs are fine.  You may begin to exercise again as  you feel able.  Do not lift any weights for two weeks. Do not put anything in the vagina for two weeks--no tampons, intercourse, or douching.    REGULAR MEDIATIONS/VITAMINS: You may restart all of your regular medications as prescribed. You may restart all of your vitamins as you normally take them.    PLEASE CALL OR SEEK MEDICAL CARE IF: You have persistent nausea and vomiting.  You have trouble eating or drinking.  You have an oral temperature above 100.5.  You have constipation that is not helped by adjusting diet or increasing fluid intake. Pain medicines are a common cause of constipation.  You have heavy vaginal bleeding You have redness or drainage from your incision(s) or there is increasing pain or tenderness near or in the surgical site.

## 2023-02-25 NOTE — Transfer of Care (Signed)
Immediate Anesthesia Transfer of Care Note  Patient: Autumn Duncan  Procedure(s) Performed: XI ROBOTIC ASSISTED TOTAL HYSTERECTOMY WITH SALPINGECTOMY (Bilateral: Abdomen) CYSTOSCOPY (Urethra) XI ROBOTIC ASSISTED LAPAROSCOPIC OVARIAN CYSTECTOMY (Left: Abdomen)  Patient Location: PACU  Anesthesia Type:General  Level of Consciousness: awake, alert , and oriented  Airway & Oxygen Therapy: Patient Spontanous Breathing and Patient connected to nasal cannula oxygen  Post-op Assessment: Report given to RN and Post -op Vital signs reviewed and stable  Post vital signs: Reviewed and stable  Last Vitals:  Vitals Value Taken Time  BP    Temp 36.6 C 02/25/23 1056  Pulse 80 02/25/23 1057  Resp 14 02/25/23 1057  SpO2 100 % 02/25/23 1057  Vitals shown include unvalidated device data.  Last Pain:  Vitals:   02/25/23 0713  TempSrc: Oral  PainSc: 0-No pain      Patients Stated Pain Goal: 4 (02/25/23 0713)  Complications: No notable events documented.

## 2023-02-25 NOTE — Anesthesia Procedure Notes (Signed)
Procedure Name: Intubation Date/Time: 02/25/2023 8:09 AM  Performed by: Arriah Wadle D, CRNAPre-anesthesia Checklist: Patient identified, Emergency Drugs available, Suction available and Patient being monitored Patient Re-evaluated:Patient Re-evaluated prior to induction Oxygen Delivery Method: Circle system utilized Preoxygenation: Pre-oxygenation with 100% oxygen Induction Type: IV induction Ventilation: Mask ventilation without difficulty Grade View: Grade I Tube type: Oral Tube size: 7.0 mm Number of attempts: 1 Airway Equipment and Method: Stylet and Oral airway Placement Confirmation: ETT inserted through vocal cords under direct vision, positive ETCO2 and breath sounds checked- equal and bilateral Secured at: 22 cm Tube secured with: Tape Dental Injury: Teeth and Oropharynx as per pre-operative assessment

## 2023-02-26 ENCOUNTER — Encounter (HOSPITAL_BASED_OUTPATIENT_CLINIC_OR_DEPARTMENT_OTHER): Payer: Self-pay | Admitting: Obstetrics and Gynecology

## 2023-02-27 LAB — SURGICAL PATHOLOGY

## 2023-03-04 ENCOUNTER — Telehealth: Payer: Self-pay | Admitting: *Deleted

## 2023-03-04 NOTE — Telephone Encounter (Signed)
[place] returned call and asked for call back. I called her with Interpreter Alvera Singh and gave her results per Dr. Briscoe Deutscher. She voices understanding and states she is doing well , still having pain. We discussed this is normal as she had surgery. She is taking her Oxycodone and ibuprofen and they do relieve her pain. I reviewed her post op appointment date with her.  Autumn Duncan

## 2023-03-04 NOTE — Telephone Encounter (Signed)
I called [place] with Interpreter Alvera Singh and left message we are calling with non-urgent results. Please call us back. Nancy Fetter

## 2023-03-04 NOTE — Telephone Encounter (Signed)
-----   Message from Lorriane Shire, MD sent at 03/03/2023  5:41 PM EDT ----- Notify of benign surgical pathology from surgery - uterus and fibroids a little over 1kg were removed. Hopefully she is recovering well and I will see her at her follow up appointment

## 2023-03-18 ENCOUNTER — Ambulatory Visit (INDEPENDENT_AMBULATORY_CARE_PROVIDER_SITE_OTHER): Payer: Self-pay | Admitting: Obstetrics and Gynecology

## 2023-03-18 ENCOUNTER — Encounter: Payer: Self-pay | Admitting: Obstetrics and Gynecology

## 2023-03-18 VITALS — BP 136/79 | HR 85 | Wt 190.0 lb

## 2023-03-18 DIAGNOSIS — N809 Endometriosis, unspecified: Secondary | ICD-10-CM

## 2023-03-18 DIAGNOSIS — Z09 Encounter for follow-up examination after completed treatment for conditions other than malignant neoplasm: Secondary | ICD-10-CM

## 2023-03-18 NOTE — Progress Notes (Signed)
   POSTOPERATIVE VISIT NOTE   Subjective:     Autumn Duncan is a 52 y.o. No obstetric history on file. who presents to the clinic 3 weeks status post  RA-TLH, BS, cysto  for abnormal uterine bleeding and fibroids. Eating a regular diet without difficulty. Bowel movements are normal. Pain is controlled with current analgesics. Medications being used: acetaminophen and ibuprofen (OTC). Incision: well healed, no concerns Vaginal bleeding: none Resumed sexual acitivity: no  The following portions of the patient's history were reviewed and updated as appropriate: allergies, current medications, past family history, past medical history, past social history, past surgical history, and problem list..   Review of Systems Pertinent items are noted in HPI.    Objective:    BP (!) 150/90   Pulse 87   Wt 190 lb (86.2 kg)   BMI 30.67 kg/m  General:  alert, cooperative, and no distress  Abdomen: soft, non-tender  Incision:   healing well, no drainage, no erythema, no hernia, no seroma, no swelling, some glue still present, no dehiscence, incision well approximated  Pelvic:   Exam deferred.    Pathology Results: FINAL MICROSCOPIC DIAGNOSIS:   A. 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 UTERUS, CERVIX, BILATERAL FALLOPIAN TUBES, HYSTERECTOMY AND  SALPINGECTOMY:  - Cervix:     - Benign squamous mucosa with reactive changes; negative for  dysplasia    - Benign endocervical polyp      - Nabothian cysts  - Endometrium:     - Benign endometrium with marked pseudodecidualization consistent  with progestin effect; negative for hyperplasia  - Myometrium:     - Leiomyomata with degenerative changes  - Bilateral, benign fimbriated fallopian tubes within normal limits  - Negative for malignancy  - See comment    Assessment:   Doing well postoperatively. Operative findings  again reviewed. Pathology report discussed.   1. Postop check Doing well Continue NSAIDs and APAP for pain control   2. Endometriosis Reviewed introp images     Plan:   1. Continue any current medications. 2. Reviewed intraoperative photos and path diagnosis of endometriosis. Preop complaint primarily bleeding, will observe for any pain around time of expected menses.  3. Activity restrictions:  pelvic rest, no heavy lifting 4. Anticipated return to work:  restrictions remain until at least 8 weeks . 5. Cuff check at 6-8 weeks   Lorriane Shire, MD Obstetrician & Gynecologist, [facility] for [facility], [facility]

## 2023-03-20 NOTE — Progress Notes (Signed)
Patient ID: Autumn Duncan, female    DOB: [birth year]  MRN: [id number]  CC: Follow-Up  Subjective: Autumn Duncan is a 52 y.o. female who presents for follow-up.   Her concerns today include:  - Patient states that she would like to see if she is due for any vaccines on today. I discussed with Jessie Foot, CMA patient's request/plan. Also, patient requests referral for routine mammogram and colonoscopy.  - Patient had recent postop appointment check with Gynecology on 03/18/2023. Patient states she is aware to keep all follow-up appointments with Gynecology and aware of plan discussed during the same appointment.  - No further issues/concerns for discussion today.  Patient Active Problem List   Diagnosis Date Noted   Abnormal uterine bleeding 02/25/2023   Intramural and submucous leiomyoma of uterus 02/25/2023   Syncope and collapse 12/13/2022   Menorrhagia 12/13/2022   Class 1 obesity 12/13/2022     Current Outpatient Medications on File Prior to Visit  Medication Sig Dispense Refill   acetaminophen (TYLENOL) 500 MG tablet Take 2 tablets (1,000 mg total) by mouth every 6 (six) hours as needed. 90 tablet 1   docusate sodium (COLACE) 100 MG capsule Take 1 capsule (100 mg total) by mouth daily as needed. 30 capsule 2   ferrous sulfate 325 (65 FE) MG EC tablet Take 1 tablet (325 mg total) by mouth 2 (two) times daily. (Patient taking differently: Take 325 mg by mouth 2 (two) times daily.) 60 tablet 3   ibuprofen (ADVIL) 600 MG tablet Take 1 tablet (600 mg total) by mouth every 6 (six) hours as needed for mild pain or moderate pain. 60 tablet 1   polyethylene glycol (MIRALAX / GLYCOLAX) 17 g packet Take 17 g by mouth daily. 14 each 1   oxyCODONE (OXY IR/ROXICODONE) 5 MG immediate release tablet Take 1 tablet (5 mg total) by mouth every 4 (four) hours as needed for severe pain or breakthrough pain. (Patient not taking: Reported on 03/24/2023) 20 tablet 0   No current  facility-administered medications on file prior to visit.    No Known Allergies  Social History   Socioeconomic History   Marital status: Single    Spouse name: Not on file   Number of children: 3   Years of education: Not on file   Highest education level: 10th grade  Occupational History   Not on file  Tobacco Use   Smoking status: Never   Smokeless tobacco: Never  Vaping Use   Vaping Use: Never used  Substance and Sexual Activity   Alcohol use: Not Currently   Drug use: Never   Sexual activity: Yes    Birth control/protection: Surgical  Other Topics Concern   Not on file  Social History Narrative   Not on file   Social Determinants of Health   Financial Resource Strain: Not on file  Food Insecurity: No Food Insecurity (12/14/2022)   Hunger Vital Sign    Worried About Running Out of Food in the Last Year: Never true    Ran Out of Food in the Last Year: Never true  Transportation Needs: No Transportation Needs (12/14/2022)   PRAPARE - Transportation    Lack of Transportation (Medical): No    Lack of Transportation (Non-Medical): No  Physical Activity: Not on file  Stress: Not on file  Social Connections: Not on file  Intimate Partner Violence: Not At Risk (12/14/2022)   Humiliation, Afraid, Rape, and Kick questionnaire    Fear of Current  or Ex-Partner: No    Emotionally Abused: No    Physically Abused: No    Sexually Abused: No    Family History  Problem Relation Age of Onset   Thyroid disease Mother     Past Surgical History:  Procedure Laterality Date   CESAREAN SECTION WITH BILATERAL TUBAL LIGATION  2000   [place]LE[place], [place]   CYSTOSCOPY N/A 02/25/2023   Procedure: CYSTOSCOPY;  Surgeon: Lorriane Shire, MD;  Location: [facility];  Service: Gynecology;  Laterality: N/A;   ROBOTIC ASSISTED LAPAROSCOPIC OVARIAN CYSTECTOMY Left 02/25/2023   Procedure: XI ROBOTIC ASSISTED LAPAROSCOPIC OVARIAN CYSTECTOMY;   Surgeon: Lorriane Shire, MD;  Location: [facility];  Service: Gynecology;  Laterality: Left;   ROBOTIC ASSISTED TOTAL HYSTERECTOMY Bilateral 02/25/2023   Procedure: XI ROBOTIC ASSISTED TOTAL HYSTERECTOMY WITH SALPINGECTOMY;  Surgeon: Lorriane Shire, MD;  Location: [facility];  Service: Gynecology;  Laterality: Bilateral;    ROS: Review of Systems Negative except as stated above  PHYSICAL EXAM: BP 133/86   Pulse 80   Temp 99.5 F (37.5 C) (Oral)   Resp 16   Wt 190 lb (86.2 kg)   SpO2 98%   BMI 30.67 kg/m   Physical Exam HENT:     Head: Normocephalic and atraumatic.     Nose: Nose normal.     Mouth/Throat:     Mouth: Mucous membranes are moist.     Pharynx: Oropharynx is clear.  Eyes:     Extraocular Movements: Extraocular movements intact.     Conjunctiva/sclera: Conjunctivae normal.     Pupils: Pupils are equal, round, and reactive to light.  Cardiovascular:     Rate and Rhythm: Normal rate and regular rhythm.     Pulses: Normal pulses.     Heart sounds: Normal heart sounds.  Pulmonary:     Effort: Pulmonary effort is normal.     Breath sounds: Normal breath sounds.  Musculoskeletal:     Cervical back: Normal range of motion and neck supple.  Neurological:     General: No focal deficit present.     Mental Status: She is alert and oriented to person, place, and time.  Psychiatric:        Mood and Affect: Mood normal.        Behavior: Behavior normal.      ASSESSMENT AND PLAN: 1. Encounter for screening mammogram for malignant neoplasm of breast - Routine screening.  - MM Digital Screening; Future  2. Colon cancer screening - Routine screening.  - Ambulatory referral to Gastroenterology  3. Language barrier - [employer]. Name: [name]  ID#: [id number]     Patient was given the opportunity to ask questions.  Patient verbalized understanding of the plan and was able to repeat key elements of the plan. Patient was  given clear instructions to go to Emergency Department or return to medical center if symptoms don't improve, worsen, or new problems develop.The patient verbalized understanding.   Orders Placed This Encounter  Procedures   MM Digital Screening   Ambulatory referral to Gastroenterology    Follow-up with primary provider as scheduled.   Rema Fendt, NP

## 2023-03-24 ENCOUNTER — Encounter: Payer: Self-pay | Admitting: Family

## 2023-03-24 ENCOUNTER — Ambulatory Visit (INDEPENDENT_AMBULATORY_CARE_PROVIDER_SITE_OTHER): Payer: Self-pay | Admitting: Family

## 2023-03-24 VITALS — BP 133/86 | HR 80 | Temp 99.5°F | Resp 16 | Wt 190.0 lb

## 2023-03-24 DIAGNOSIS — Z1211 Encounter for screening for malignant neoplasm of colon: Secondary | ICD-10-CM

## 2023-03-24 DIAGNOSIS — Z1231 Encounter for screening mammogram for malignant neoplasm of breast: Secondary | ICD-10-CM

## 2023-03-24 DIAGNOSIS — Z758 Other problems related to medical facilities and other health care: Secondary | ICD-10-CM

## 2023-03-24 DIAGNOSIS — Z603 Acculturation difficulty: Secondary | ICD-10-CM

## 2023-03-24 NOTE — Progress Notes (Signed)
Patient c/o still having pain with movement following her surgery. Patient has seen her GYN for f/up  but pain still present . - Patient c/o of low grade fever at times as well

## 2023-03-27 ENCOUNTER — Other Ambulatory Visit: Payer: Self-pay | Admitting: Obstetrics and Gynecology

## 2023-03-27 DIAGNOSIS — Z1231 Encounter for screening mammogram for malignant neoplasm of breast: Secondary | ICD-10-CM

## 2023-04-01 ENCOUNTER — Telehealth: Payer: Self-pay | Admitting: *Deleted

## 2023-04-02 NOTE — Telephone Encounter (Signed)
Open in error

## 2023-04-15 ENCOUNTER — Ambulatory Visit (INDEPENDENT_AMBULATORY_CARE_PROVIDER_SITE_OTHER): Payer: Self-pay | Admitting: Obstetrics and Gynecology

## 2023-04-15 ENCOUNTER — Encounter: Payer: Self-pay | Admitting: Obstetrics and Gynecology

## 2023-04-15 VITALS — BP 139/92 | Ht 66.0 in | Wt 188.0 lb

## 2023-04-15 DIAGNOSIS — N920 Excessive and frequent menstruation with regular cycle: Secondary | ICD-10-CM

## 2023-04-15 DIAGNOSIS — Z09 Encounter for follow-up examination after completed treatment for conditions other than malignant neoplasm: Secondary | ICD-10-CM

## 2023-04-15 NOTE — Progress Notes (Signed)
   POSTOPERATIVE VISIT NOTE   Subjective:     Autumn Duncan is a 52 y.o. No obstetric history on file. who presents to the clinic 7 weeks status post  RA-TLH, BS, cysto  for abnormal uterine bleeding. Eating a regular diet without difficulty. Bowel movements are normal. Pain is controlled with current analgesics. Medications being used: acetaminophen and ibuprofen (OTC). Incision: doing well  Has some intermittent discomfort around LUQ incision but otherwise ok Vaginal bleeding: none Resumed sexual acitivity: no   The following portions of the patient's history were reviewed and updated as appropriate: allergies, current medications, past family history, past medical history, past social history, past surgical history, and problem list..   Review of Systems Pertinent items are noted in HPI.    Objective:    BP (!) 139/92   Ht 5\' 6"  (1.676 m)   Wt 188 lb (85.3 kg)   BMI 30.34 kg/m  General:  alert, cooperative, and no distress  Abdomen: soft, bowel sounds active, non-tender  Incision:   healing well, no drainage, no erythema, no hernia, no seroma, no swelling, no dehiscence, incision well approximated  Pelvic:    Normal external genitalia, well approximated cuff with visible suture, nontender and intact on single digit palpation     Pathology Results: FINAL MICROSCOPIC DIAGNOSIS:   A. 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 UTERUS, CERVIX, BILATERAL FALLOPIAN TUBES, HYSTERECTOMY AND  SALPINGECTOMY:  - Cervix:     - Benign squamous mucosa with reactive changes; negative for  dysplasia    - Benign endocervical polyp      - Nabothian cysts  - Endometrium:     - Benign endometrium with marked pseudodecidualization consistent  with progestin effect; negative for hyperplasia  - Myometrium:     - Leiomyomata with degenerative changes  - Bilateral, benign  fimbriated fallopian tubes within normal limits  - Negative for malignancy  - See comment    Assessment:   Doing well postoperatively. Operative findings again reviewed. Pathology report discussed.   1. Postop check Doing well  Continue pelvic rest but can slowly return to other actvities  2. Menorrhagia with regular cycle Resolved s/p hysterectomy    Plan:   1. Continue any current medications. 2. Slow return to usual activities 3. Activity restrictions:  pelvic rest for additional 4 weeks 4. Anticipated return to work: now. 5. 4 weeks for repeat cuff check   Lorriane Shire, MD Obstetrician & Gynecologist, [facility] for [employer], [facility]

## 2023-05-20 ENCOUNTER — Other Ambulatory Visit (HOSPITAL_COMMUNITY)
Admission: RE | Admit: 2023-05-20 | Discharge: 2023-05-20 | Disposition: A | Discharge status: Home / Self Care | Payer: Self-pay | Source: Ambulatory Visit | Attending: Obstetrics and Gynecology | Admitting: Obstetrics and Gynecology

## 2023-05-20 ENCOUNTER — Ambulatory Visit (INDEPENDENT_AMBULATORY_CARE_PROVIDER_SITE_OTHER): Payer: Self-pay | Admitting: Obstetrics and Gynecology

## 2023-05-20 ENCOUNTER — Encounter: Payer: Self-pay | Admitting: Obstetrics and Gynecology

## 2023-05-20 VITALS — BP 132/85 | HR 71 | Wt 195.7 lb

## 2023-05-20 DIAGNOSIS — N898 Other specified noninflammatory disorders of vagina: Secondary | ICD-10-CM | POA: Insufficient documentation

## 2023-05-20 NOTE — Progress Notes (Signed)
   POSTOPERATIVE VISIT NOTE   Subjective:     Autumn Duncan is a 52 y.o. No obstetric history on file. who presents to the clinic 12 weeks status post  RA-TLH, BS, cysto  for abnormal uterine bleeding. postop followup. Has been having  intermittent LLQ pain but not too severe and very inconsistent. Otherwise, has been feeling well.   The following portions of the patient's history were reviewed and updated as appropriate: allergies, current medications, past family history, past medical history, past social history, past surgical history, and problem list..   Review of Systems Pertinent items are noted in HPI.    Objective:    BP 132/85   Pulse 71   Wt 195 lb 11.2 oz (88.8 kg)   BMI 31.59 kg/m  General:  alert, cooperative, and no distress  Abdomen: soft, bowel sounds active, non-tender  Incision:   healing well, no drainage, no erythema, no hernia, no seroma, no swelling, no dehiscence, incision well approximated  Pelvic:    Well approximated vaginal cuff with slight tenderness in left apex, small volume discharge       Assessment:   Doing well postoperatively. Operative findings again reviewed. Pathology report discussed.   1. Vaginal discharge Vaginitis swab collected, concern for possible BV - Cervicovaginal ancillary only    Plan:   1. Continue any current medications. 2. Return to all other activities 3. Activity restrictions:  pelvic rest for 2 additional weeks 4. Follow up as needed   Lorriane Shire, MD Obstetrician & Gynecologist, [facility] for [employer], [facility]

## 2023-05-22 ENCOUNTER — Other Ambulatory Visit: Payer: Self-pay | Admitting: Obstetrics and Gynecology

## 2023-05-22 DIAGNOSIS — B3731 Acute candidiasis of vulva and vagina: Secondary | ICD-10-CM

## 2023-05-22 MED ORDER — FLUCONAZOLE 150 MG PO TABS
150.0000 mg | ORAL_TABLET | Freq: Once | ORAL | 3 refills | Status: AC
Start: 2023-05-22 — End: 2023-05-22

## 2023-05-23 ENCOUNTER — Telehealth: Payer: Self-pay | Admitting: *Deleted

## 2023-05-23 NOTE — Telephone Encounter (Signed)
I called patient with Interpreter Alvera Singh and left message we are calling with non-urgent results and RX sent to your pharmacy. Call us if questions. Nancy Fetter

## 2023-05-23 NOTE — Telephone Encounter (Signed)
-----   Message from Lorriane Shire sent at 05/22/2023  2:08 PM EDT ----- Notify swab positive for yeast infection, medication sent to pharmacy

## 2023-05-26 NOTE — Telephone Encounter (Signed)
Called patient with Autumn Duncan assisting with spanish interpretation. Reviewed results and medication instructions. Patient verbalized understanding.

## 2023-05-28 ENCOUNTER — Telehealth: Payer: Self-pay | Admitting: Family Medicine

## 2023-05-28 NOTE — Telephone Encounter (Signed)
Spanish speaking patient called in stating that she woke up with a bump on her vagina ad it is swollen, growing rapidly and is very painful.

## 2023-05-29 ENCOUNTER — Ambulatory Visit: Payer: Self-pay

## 2023-05-29 ENCOUNTER — Ambulatory Visit (INDEPENDENT_AMBULATORY_CARE_PROVIDER_SITE_OTHER): Payer: Medicaid Other | Admitting: Obstetrics and Gynecology

## 2023-05-29 ENCOUNTER — Encounter: Payer: Self-pay | Admitting: Obstetrics and Gynecology

## 2023-05-29 VITALS — BP 123/75 | HR 85 | Wt 195.0 lb

## 2023-05-29 DIAGNOSIS — Z603 Acculturation difficulty: Secondary | ICD-10-CM | POA: Diagnosis not present

## 2023-05-29 DIAGNOSIS — Z758 Other problems related to medical facilities and other health care: Secondary | ICD-10-CM

## 2023-05-29 DIAGNOSIS — N9089 Other specified noninflammatory disorders of vulva and perineum: Secondary | ICD-10-CM

## 2023-05-29 MED ORDER — SULFAMETHOXAZOLE-TRIMETHOPRIM 800-160 MG PO TABS
1.0000 | ORAL_TABLET | Freq: Two times a day (BID) | ORAL | 0 refills | Status: AC
Start: 2023-05-29 — End: 2023-06-03

## 2023-05-29 NOTE — Progress Notes (Signed)
Patient informed me that she has a bump on labia majora that causes her slight pain. She stated the bump has been there since Tuesday of this week. Denies any fluid coming from the bump.   She describes the bump as "very red"

## 2023-05-29 NOTE — Progress Notes (Signed)
GYNECOLOGY VISIT  Patient name: Autumn Duncan MRN [id number]  Date of birth: [birth year] Chief Complaint:   Gynecologic Exam  History:  Nychelle Zablocki is a 52 y.o. No obstetric history on file. being seen today for new vulvar lesion. Last shaved last Monday. Noted this lesion Tuesday - very painful initially. Had fever yesterday and last night. Vulva is tender. Not currently sexually active. Has never had anything like this before.     Past Medical History:  Diagnosis Date   Abnormal uterine bleeding (AUB)    Anemia    Anemia due to chronic blood loss    menorrhgia   History of syncope 12/13/2022   ED visit w/ admission ;   due to symptomatic anemia, Hg 5.4, transfused one PRBC and IV Iron infusion, at dischage Hg 7.7   Symptomatic anemia    Uterine fibroid     Past Surgical History:  Procedure Laterality Date   CESAREAN SECTION WITH BILATERAL TUBAL LIGATION  2000   CHOLECYSTECTOMY, [place]AROSCOPIC  2015   [place]   CYSTOSCOPY N/A 02/25/2023   Procedure: CYSTOSCOPY;  Surgeon: Lorriane Shire, MD;  Location: [facility];  Service: Gynecology;  Laterality: N/A;   ROBOTIC ASSISTED LAPAROSCOPIC OVARIAN CYSTECTOMY Left 02/25/2023   Procedure: XI ROBOTIC ASSISTED LAPAROSCOPIC OVARIAN CYSTECTOMY;  Surgeon: Lorriane Shire, MD;  Location: [facility];  Service: Gynecology;  Laterality: Left;   ROBOTIC ASSISTED TOTAL HYSTERECTOMY Bilateral 02/25/2023   Procedure: XI ROBOTIC ASSISTED TOTAL HYSTERECTOMY WITH SALPINGECTOMY;  Surgeon: Lorriane Shire, MD;  Location: [facility];  Service: Gynecology;  Laterality: Bilateral;    The following portions of the patient's history were reviewed and updated as appropriate: allergies, current medications, past family history, past medical history, past social history, past surgical history and problem list.     Review of Systems:  Pertinent items are noted in HPI. Comprehensive  review of systems was otherwise negative.   Objective:  Physical Exam BP 123/75   Pulse 85   Wt 195 lb (88.5 kg)   BMI 31.47 kg/m    Physical Exam Vitals and nursing note reviewed. Exam conducted with a chaperone present.  Constitutional:      Appearance: Normal appearance.  HENT:     Head: Normocephalic and atraumatic.  Pulmonary:     Effort: Pulmonary effort is normal.  Genitourinary:      Comments: ~1cm ulcerative lesion, tender without surrounding erythema and tenderness that extends superolaterally on left side of lower mons, no active drainage or bleeding Mild groin tenderness as well on the right Nontender left labia majora Skin:    General: Skin is warm and dry.  Neurological:     General: No focal deficit present.     Mental Status: She is alert.  Psychiatric:        Mood and Affect: Mood normal.        Behavior: Behavior normal.        Thought Content: Thought content normal.        Judgment: Judgment normal.       Assessment & Plan:   1. Lesion of vulva Bacterial and viral culture sent. Possible skin infection following shaving vs HSV though larger than typical HSV lesion. Bactrim now and return in 1 week. If lesion remains, possible biopsy.  - Anaerobic and Aerobic Culture - Herpes simplex virus culture - sulfamethoxazole-trimethoprim (BACTRIM DS) 800-160 MG tablet; Take 1 tablet by mouth 2 (two) times daily for 5 days.  Dispense: 10 tablet; Refill: 0  2. Language barrier Video interpreter used for encounter   Lorriane Shire, MD [facility]imally Invasive [facility] for [facility], [facility]

## 2023-05-29 NOTE — Telephone Encounter (Signed)
Returned call to patient with assistance of [employer], [place].   Patient reports she came in on 7/30 and was treated for yeast.   She reports on Tuesday she noted an "ulcer" on the left side of her vagina, it is painful. The pain is less today, the pain was more prevalent yesterday. When questioned further she reports it is a swelling, it is not open and not draining. She reports it is on the Labia majora directly above the vaginal opening.  She reports she feels a lump in the area. She does shave and last shaved prior to appt on 7/30.   She denies history of HSV. She is not sexually active currently. She has not experienced this before.   Reviewed can have blocked hair follicles and to apply warm wet washcloths 2-3 times a day to help it open and drain.   Offered appointment for evaluation or to send in My Chart message. Patient reports she is feeling much better and if an appointment is available she would like to come in. Reviewed can also send in picture from Mychart.   Spoke with Dr. Briscoe Deutscher who is willing to look at area today if patient can come in at 2:15 pm today. Patient agreeable.

## 2023-06-02 ENCOUNTER — Telehealth: Payer: Self-pay | Admitting: Obstetrics and Gynecology

## 2023-06-02 DIAGNOSIS — B009 Herpesviral infection, unspecified: Secondary | ICD-10-CM

## 2023-06-02 DIAGNOSIS — N9089 Other specified noninflammatory disorders of vulva and perineum: Secondary | ICD-10-CM

## 2023-06-02 MED ORDER — VALACYCLOVIR HCL 1 G PO TABS
1000.0000 mg | ORAL_TABLET | Freq: Two times a day (BID) | ORAL | 3 refills | Status: AC
Start: 2023-06-02 — End: ?

## 2023-06-02 NOTE — Telephone Encounter (Signed)
Attempted to call patient x2, no answer and left voicemail with assistance of spanish interpreter  Redge Gainer ID# [phone]

## 2023-06-03 ENCOUNTER — Telehealth: Payer: Self-pay

## 2023-06-03 NOTE — Telephone Encounter (Addendum)
-----   Message from Lorriane Shire sent at 06/02/2023  5:49 PM EDT ----- Please notify patient of positive HSV culture from vulvar lesion. Recommend valtrex antiviral medication and to keep follow up appointment    Called pt with interpreter Debarah Crape; VM left stating I am calling with results and will try to contact patient a second time.  Patient returned phone call. Reviewed results with interpreter Claudia. Pt is shocked because she has not been sexually active for the past 7-8 months. Reviewed this is a sexually transmitted infection but we are unable to know when she was exposed. Initial treatment reviewed and Good Rx coupon provided. Pt is having pain in vaginal area. Also reports feeling she had a fever and chills overnight, did not take temperature at home. Did take ibuprofen at that time and is feeling better today. Offered OTC Tylenol, ibuprofen, or lidocaine gel for pain as needed. Plans to pick up Valtrex immediately. Pt plans to keep appt on Thursday to discuss further.

## 2023-06-05 ENCOUNTER — Ambulatory Visit: Payer: Self-pay

## 2023-06-05 ENCOUNTER — Ambulatory Visit (INDEPENDENT_AMBULATORY_CARE_PROVIDER_SITE_OTHER): Payer: Medicaid Other | Admitting: Obstetrics and Gynecology

## 2023-06-05 ENCOUNTER — Ambulatory Visit
Admission: RE | Admit: 2023-06-05 | Discharge: 2023-06-05 | Disposition: A | Discharge status: Home / Self Care | Payer: Medicaid Other | Source: Ambulatory Visit | Attending: Obstetrics and Gynecology | Admitting: Obstetrics and Gynecology

## 2023-06-05 DIAGNOSIS — B009 Herpesviral infection, unspecified: Secondary | ICD-10-CM

## 2023-06-05 DIAGNOSIS — Z1231 Encounter for screening mammogram for malignant neoplasm of breast: Secondary | ICD-10-CM

## 2023-06-05 NOTE — Progress Notes (Signed)
GYNECOLOGY VISIT  Patient name: Autumn Duncan MRN [id number]  Date of birth: Mar 14, 1971 Chief Complaint:   Follow-up and labia bump follow up   History:  Autumn Duncan is a 52 y.o. No obstetric history on file. being seen today for vulvar lesions followup. Picked up and started taking valtex and noted improvement. Wondering when she would have contracted it - has been with one partner for 3 years and last intercourse was 7-8 months ago. No oral intercourse received with current partner due to bleeding and partner denies having diagnosis.     Past Medical History:  Diagnosis Date   Abnormal uterine bleeding (AUB)    Anemia    Anemia due to chronic blood loss    menorrhgia   History of syncope 12/13/2022   ED visit w/ admission ;   due to symptomatic anemia, Hg 5.4, transfused one PRBC and IV Iron infusion, at dischage Hg 7.7   Symptomatic anemia    Uterine fibroid     Past Surgical History:  Procedure Laterality Date   CESAREAN SECTION WITH BILATERAL TUBAL LIGATION  2000   CHOLECYSTECTOMY, [place]AROSCOPIC  2015   [place]   CYSTOSCOPY N/A 02/25/2023   Procedure: CYSTOSCOPY;  Surgeon: Lorriane Shire, MD;  Location: [facility];  Service: Gynecology;  Laterality: N/A;   ROBOTIC ASSISTED LAPAROSCOPIC OVARIAN CYSTECTOMY Left 02/25/2023   Procedure: XI ROBOTIC ASSISTED LAPAROSCOPIC OVARIAN CYSTECTOMY;  Surgeon: Lorriane Shire, MD;  Location: [facility];  Service: Gynecology;  Laterality: Left;   ROBOTIC ASSISTED TOTAL HYSTERECTOMY Bilateral 02/25/2023   Procedure: XI ROBOTIC ASSISTED TOTAL HYSTERECTOMY WITH SALPINGECTOMY;  Surgeon: Lorriane Shire, MD;  Location: [facility];  Service: Gynecology;  Laterality: Bilateral;    The following portions of the patient's history were reviewed and updated as appropriate: allergies, current medications, past family history, past medical history, past social history, past  surgical history and problem list.    Review of Systems:  Pertinent items are noted in HPI. Comprehensive review of systems was otherwise negative.   Objective:  Physical Exam BP (!) 105/102   Pulse (!) 106   Wt 193 lb (87.5 kg)   BMI 31.15 kg/m    Physical Exam Vitals and nursing note reviewed. Exam conducted with a chaperone present.  Constitutional:      Appearance: Normal appearance.  HENT:     Head: Normocephalic and atraumatic.  Pulmonary:     Effort: Pulmonary effort is normal.     Breath sounds: Normal breath sounds.  Genitourinary:    General: Normal vulva.     Exam position: Lithotomy position.     Vagina: Normal.     Cervix: Normal.       Comments: Well healed scabbed over area on lower left mons, nontender Skin:    General: Skin is warm and dry.  Neurological:     General: No focal deficit present.     Mental Status: She is alert.  Psychiatric:        Mood and Affect: Mood normal.        Behavior: Behavior normal.        Thought Content: Thought content normal.        Judgment: Judgment normal.          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 Noted that virus can be transmitted when shedding even if no visible sore.  Reviewed may or may not have recurrences and if episodic can take valtrex prn. Answered all questions.   Routine preventative health maintenance measures emphasized.  Lorriane Shire, MD Minimally Invasive Gynecologic Surgery [facility] for [facility], [facility]

## 2023-06-05 NOTE — Patient Instructions (Addendum)
Herpes genital Genital Herpes El herpes genital es una infeccin de transmisin sexual (ITS) frecuente causada por un virus. El virus se transmite de [place] persona a otra a travs del contacto con una llaga, saliva infectada o piel infectada. El virus puede causar picazn, ampollas y Advertising account planner en los genitales o en el recto. Durante un brote de infeccin, los sntomas pueden durar varios das y Clinical biochemist. Sin embargo, el virus [employer] cuerpo, de modo que es posible tener ms brotes en el futuro. El [employer] las erupciones vara: pueden transcurrir de meses a aos. El herpes genital puede afectar a Emergency planning/management officer. Es particularmente preocupante para las embarazadas porque el virus puede transmitirse al beb durante el parto. El herpes genital tambin es un motivo de preocupacin para las personas que tienen debilitado el sistema encargado de combatir las enfermedades (sistema inmunitario). Cules son las causas? Esta afeccin es causada por el virus del herpes simple (VHS) tipo 1 o tipo 2 (VHS-1 o VHS-2). El virus puede transmitirse a travs de lo siguiente: El contacto sexual con una persona infectada, incluido el sexo vaginal, anal y oral. El contacto con una llaga de herpes. La piel. Esto significa que puede contraer herpes de una pareja infectada incluso si no hay ampollas ni llagas presentes. Es posible que su pareja no sepa que tiene la infeccin. Qu incrementa el riesgo? Es ms probable que contraiga esta afeccin si: Tiene relaciones sexuales con muchas parejas. No [place] preservativos de ltex o poliuretano cuando tiene Clinical research associate. Cules son los signos o sntomas? La [employer] de las personas no presentan sntomas o tienen sntomas leves que pueden confundirse con otros problemas de la piel. Entre los sntomas, se pueden incluir los siguientes: Pequeos bultos rojos cerca de los genitales, del recto o de la boca. Estos bultos se convierten en ampollas y Electrical engineer. Sntomas similares a los de la gripe que incluyen: [place]. Dolores [employer] cuerpo. Ganglios linfticos hinchados. Dolor de [place]. Dolor al Beatrix Shipper. Dolor y picazn en la zona genital o en el rea rectal. Secrecin vaginal. Hormigueo o dolor punzante en las piernas y en las nalgas. Por lo general, los sntomas son ms intensos y duran ms durante la primera erupcin (primaria). Los sntomas similares a los de la gripe tambin son ms frecuentes Physicist, medical brote. Cmo se diagnostica? Esta afeccin se puede diagnosticar en funcin de lo siguiente: Un examen fsico. Sus antecedentes mdicos. Anlisis de [place]. Anlisis de [place] del lquido (cultivo) de Heritage manager. Cmo se trata? No existe ninguna cura para esta afeccin, pero el tratamiento con medicamentos antivirales puede lograr lo siguiente: Camera operator recuperacin y [employer] sntomas. Ayudar a reducir Interior and spatial designer del virus a las Chief Strategy Officer. Limitar las probabilidades de futuras erupciones o reducir su duracin. Aliviar los sntomas de futuras erupciones. El mdico tambin puede recomendarle medicamentos de venta libre para ayudar a Engineer, materials y Higher education careers adviser. Siga estas indicaciones en su casa: Si tiene una erupcin:  Mantenga las zonas afectadas secas y limpias. Evite frotar o tocar las ampollas y las llagas. Si toca las ampollas o llagas: [employer] las manos con agua y jabn durante al menos 20 segundos. Use desinfectante para manos con alcohol si no dispone de [place] y [place]. No se toque los ojos despus de ello. Actividad sexual No tenga contacto sexual durante erupciones activas. Practique sexo seguro. El herpes puede propagarse incluso si su pareja no tiene ampollas ni llagas. Los preservativos de  ltex o poliuretano, y los preservativos femeninos pueden ayudar a Multimedia programmer contagio del virus del herpes. Control del dolor y de las molestias Si se lo indican, aplique hielo sobre la  zona dolorida. Para hacer esto: Ponga el hielo en una bolsa plstica. Coloque una toalla entre la piel y Copy. Aplique el hielo durante 20 minutos, 2 a 3 veces por da. Retire el hielo si la piel se pone de color rojo brillante. Esto es [employer]. Si no puede sentir dolor, calor o fro, tiene un mayor riesgo de que se dae la zona. Si se lo indican, tome un bao de asiento fro para ayudar a calmar el dolor o la picazn. Un bao de asiento es un bao que se toma mientras se est sentado, con un nivel de agua que cubra la cadera y las nalgas. Indicaciones generales Use los medicamentos de venta libre y los recetados solamente como se lo haya indicado el mdico. Si le recetaron un medicamento antiviral, selo como se lo haya indicado el mdico. No deje de usar el medicamento antiviral aunque comience a Actor. Concurra a todas las visitas de seguimiento. Esto es importante. Cmo se previene? Use preservativos. Aunque puede contraer herpes genital durante el contacto sexual, incluso con el uso de un preservativo, el preservativo puede brindar cierta proteccin. Evite tener mltiples parejas sexuales. Hable con su pareja sexual acerca de los sntomas que cualquiera de [address]. [place], hable con su pareja acerca de cualquier antecedente de ITS que pudiera tener. No tenga contacto sexual si tiene sntomas activos de herpes genital. Comunquese con un mdico si: Los sntomas no mejoran con los medicamentos. Los sntomas regresan o tiene sntomas nuevos. Tiene fiebre. Siente dolor abdominal. Presenta enrojecimiento, hinchazn o dolor en el ojo. Nota llagas nuevas en otras partes del cuerpo. Tuvo herpes y [place] o est pensando en quedar embarazada. Solicite ayuda de inmediato si: Tiene sntomas de meningitis viral. Esto es poco frecuente, pero puede ocurrir si el virus se propaga al cerebro. Entre los sntomas, se pueden incluir los siguientes: Siente un  dolor de cabeza intenso o rigidez en el cuello. Dolores musculares. Nuseas y vmitos. Sensibilidad a Statistician. Resumen El herpes genital es una infeccin de transmisin sexual (ITS) frecuente causada por el virus del herpes simple tipo 1 o tipo 2 (VHS-1 o VHS-2). Estos virus casi siempre se transmiten a travs del contacto sexual con Neomia Dear persona infectada. Es ms probable que Conservator, museum/gallery afeccin si tiene relaciones sexuales con muchas parejas o si tiene relaciones sexuales sin usar preservativo. La [employer] de las personas no presentan sntomas o tienen sntomas leves que pueden confundirse con otros problemas de la piel. Los sntomas aparecen a medida que se presentan las erupciones con meses o aos de separacin entre Neomia Dear y [place]. No existe ninguna cura para esta afeccin, pero el tratamiento con medicamentos antivirales por va oral puede [employer], reducir las probabilidades de Engineering geologist el virus a una pareja, evitar futuras erupciones o disminuir la duracin de futuras erupciones. Esta informacin no tiene Theme park manager el consejo del mdico. Asegrese de hacerle al mdico cualquier pregunta que tenga. Document Revised: 07/27/2021 Document Reviewed: 07/27/2021 Elsevier Patient Education  2024 [employer].

## 2023-06-05 NOTE — Progress Notes (Signed)
GYNECOLOGY VISIT  Patient name: Autumn Duncan MRN [id number]  Date of birth: 1971/08/29 Chief Complaint:   Follow-up and labia bump follow up   History:  Autumn Duncan is a 52 y.o. No obstetric history on file. being seen today for follow up of vulvar lesion. Significant improvement in the sore since starting valtex. Concerned about where she got it as she has been with the same partner for 3 years and has not had intercourse in 7-8 months. Reports did not have oral intercourse from partner due to bleeding and partner denies having it.     Past Medical History:  Diagnosis Date   Abnormal uterine bleeding (AUB)    Anemia    Anemia due to chronic blood loss    menorrhgia   History of syncope 12/13/2022   ED visit w/ admission ;   due to symptomatic anemia, Hg 5.4, transfused one PRBC and IV Iron infusion, at dischage Hg 7.7   Symptomatic anemia    Uterine fibroid     Past Surgical History:  Procedure Laterality Date   CESAREAN SECTION WITH BILATERAL TUBAL LIGATION  2000   CHOLECYSTECTOMY, [place]COPIC  [place]   CYSTOSCOPY N/A 02/25/2023   Procedure: CYSTOSCOPY;  Surgeon: Lorriane Shire, MD;  Location: [facility];  Service: Gynecology;  Laterality: N/A;   ROBOTIC ASSISTED LAPAROSCOPIC OVARIAN CYSTECTOMY Left 02/25/2023   Procedure: XI ROBOTIC ASSISTED LAPAROSCOPIC OVARIAN CYSTECTOMY;  Surgeon: Lorriane Shire, MD;  Location: [facility];  Service: Gynecology;  Laterality: Left;   ROBOTIC ASSISTED TOTAL HYSTERECTOMY Bilateral 02/25/2023   Procedure: XI ROBOTIC ASSISTED TOTAL HYSTERECTOMY WITH SALPINGECTOMY;  Surgeon: Lorriane Shire, MD;  Location: [facility];  Service: Gynecology;  Laterality: Bilateral;    The following portions of the patient's history were reviewed and updated as appropriate: allergies, current medications, past family history, past medical history, past social history, past  surgical history and problem list.    Review of Systems:  Pertinent items are noted in HPI. Comprehensive review of systems was otherwise negative.   Objective:  Physical Exam BP (!) 105/102   Pulse (!) 106   Wt 193 lb (87.5 kg)   BMI 31.15 kg/m    Physical Exam Vitals and nursing note reviewed. Exam conducted with a chaperone present.  Constitutional:      Appearance: Normal appearance.  HENT:     Head: Normocephalic and atraumatic.  Pulmonary:     Effort: Pulmonary effort is normal.     Breath sounds: Normal breath sounds.  Genitourinary:    General: Normal vulva.     Exam position: Lithotomy position.     Vagina: Normal.     Cervix: Normal.       Comments: Well healed scab on left lower mons Skin:    General: Skin is warm and dry.  Neurological:     General: No focal deficit present.     Mental Status: She is alert.  Psychiatric:        Mood and Affect: Mood normal.        Behavior: Behavior normal.        Thought Content: Thought content normal.        Judgment: Judgment normal.      Assessment & Plan:   1. HSV-2 (herpes simplex virus 2) infection Reviewed results and unclear when transmission may have occurred. Noted that she may or may not have recurrences and if so, can take valtrex. Reviewed that  transmission is possible if shedding virus even if no active ulcer noted. Answered all questions.    Routine preventative health maintenance measures emphasized.  Lorriane Shire, MD [facility], [facility]

## 2023-06-09 ENCOUNTER — Other Ambulatory Visit: Payer: Self-pay | Admitting: Family

## 2023-06-09 DIAGNOSIS — Z1231 Encounter for screening mammogram for malignant neoplasm of breast: Secondary | ICD-10-CM

## 2023-06-09 DIAGNOSIS — Z1211 Encounter for screening for malignant neoplasm of colon: Secondary | ICD-10-CM

## 2023-06-09 DIAGNOSIS — Z603 Acculturation difficulty: Secondary | ICD-10-CM

## 2023-06-12 ENCOUNTER — Other Ambulatory Visit: Payer: Self-pay | Admitting: Family

## 2023-06-12 DIAGNOSIS — R928 Other abnormal and inconclusive findings on diagnostic imaging of breast: Secondary | ICD-10-CM

## 2023-07-03 ENCOUNTER — Ambulatory Visit: Payer: No Typology Code available for payment source

## 2023-07-03 ENCOUNTER — Telehealth: Payer: Self-pay

## 2023-07-03 NOTE — Telephone Encounter (Signed)
Patient telephoned and spoke with Delorise Royals (interpreter). Patient stated she has insurance that expires July 21, 2023. Raynelle Fanning explained  to patient [facility] provides services for patients without insurance coverage. Patient should call her insurance and provider.
